# Patient Record
Sex: Female | Born: 1950
Health system: Southern US, Community
[De-identification: ages and names within clinical notes are randomized; demographics above are authoritative.]

## PROBLEM LIST (undated history)

## (undated) DIAGNOSIS — E785 Hyperlipidemia, unspecified: Secondary | ICD-10-CM

## (undated) HISTORY — DX: Hyperlipidemia, unspecified: E78.5

---

## 2010-07-22 HISTORY — PX: RIB FRACTURE SURGERY: SHX2358

## 2010-08-02 ENCOUNTER — Ambulatory Visit: Payer: Self-pay | Admitting: Internal Medicine

## 2010-11-21 HISTORY — PX: DIAPHRAGMATIC HERNIA REPAIR: SHX413

## 2014-10-29 LAB — HM PAP SMEAR

## 2014-11-12 ENCOUNTER — Other Ambulatory Visit: Payer: Self-pay | Admitting: Family Medicine

## 2014-11-12 DIAGNOSIS — Z1231 Encounter for screening mammogram for malignant neoplasm of breast: Secondary | ICD-10-CM

## 2014-11-17 ENCOUNTER — Ambulatory Visit
Admission: RE | Admit: 2014-11-17 | Discharge: 2014-11-17 | Disposition: A | Discharge status: Home / Self Care | Payer: No Typology Code available for payment source | Source: Ambulatory Visit | Attending: Family Medicine | Admitting: Family Medicine

## 2014-11-17 DIAGNOSIS — Z1231 Encounter for screening mammogram for malignant neoplasm of breast: Secondary | ICD-10-CM | POA: Diagnosis not present

## 2015-01-01 HISTORY — PX: COLONOSCOPY: SHX174

## 2015-01-01 LAB — HM COLONOSCOPY

## 2016-03-29 DIAGNOSIS — E785 Hyperlipidemia, unspecified: Secondary | ICD-10-CM | POA: Insufficient documentation

## 2016-03-29 DIAGNOSIS — E782 Mixed hyperlipidemia: Secondary | ICD-10-CM

## 2016-05-22 DIAGNOSIS — H524 Presbyopia: Secondary | ICD-10-CM | POA: Diagnosis not present

## 2016-05-30 ENCOUNTER — Ambulatory Visit: Payer: Self-pay | Admitting: Family Medicine

## 2016-06-02 ENCOUNTER — Ambulatory Visit: Payer: Self-pay | Admitting: Family Medicine

## 2016-06-29 ENCOUNTER — Ambulatory Visit (INDEPENDENT_AMBULATORY_CARE_PROVIDER_SITE_OTHER): Payer: Medicare HMO | Admitting: Family Medicine

## 2016-06-29 ENCOUNTER — Encounter: Payer: Self-pay | Admitting: Family Medicine

## 2016-06-29 VITALS — BP 125/82 | HR 71 | Temp 97.7°F | Ht 61.1 in | Wt 138.6 lb

## 2016-06-29 DIAGNOSIS — E782 Mixed hyperlipidemia: Secondary | ICD-10-CM

## 2016-06-29 NOTE — Progress Notes (Signed)
BP 125/82 (BP Location: Left Arm, Patient Position: Sitting, Cuff Size: Normal)   Pulse 71   Temp 97.7 F (36.5 C)   Ht 5' 1.1" (1.552 m)   Wt 138 lb 9.6 oz (62.9 kg)   SpO2 96%   BMI 26.10 kg/m    Subjective:    Patient ID: Heather Walsh, female    DOB: [birth year], 66 y.o.   MRN: [id number]  HPI: Heather Walsh is a 66 y.o. female who presents today to establish care.  Chief Complaint  Patient presents with  . Establish Care   HYPERLIPIDEMIA Hyperlipidemia status: Stable- has had elevated cholesterol in the past, doesn't want to take medication. She has been dieting and losing weight, lowest weight of adult life right now.  Satisfied with current treatment?  yes Side effects:  Not on anthing Medication compliance: poor compliance- doesn't want to take anything Past cholesterol meds: lipitor Supplements: none Aspirin:  no Chest pain:  no Coronary artery disease:  no Family history CAD:  yes  Active Ambulatory Problems    Diagnosis Date Noted  . Hyperlipidemia    Resolved Ambulatory Problems    Diagnosis Date Noted  . No Resolved Ambulatory Problems   Past Medical History:  Diagnosis Date  . Hyperlipidemia    Past Surgical History:  Procedure Laterality Date  . CESAREAN SECTION     X 2  . COLONOSCOPY  01/01/2015  . DIAPHRAGMATIC HERNIA REPAIR  11/2010  . RIB FRACTURE SURGERY Left 07/2010   repair with fixation   No outpatient encounter prescriptions on file as of 06/29/2016.   No facility-administered encounter medications on file as of 06/29/2016.    Allergies  Allergen Reactions  . Sulfa Antibiotics Swelling   Social History   Social History  . Marital status: Married    Spouse name: N/A  . Number of children: N/A  . Years of education: N/A   Occupational History  . Not on file.   Social History Main Topics  . Smoking status: Never Smoker  . Smokeless tobacco: Never Used  . Alcohol use No  . Drug use: Yes    Types: Marijuana  . Sexual  activity: Not Currently   Other Topics Concern  . Not on file   Social History Narrative  . No narrative on file   Family History  Problem Relation Age of Onset  . CAD Mother   . Cancer Mother        lung  . Heart attack Father   . Hearing loss Father   . Alcohol abuse Brother   . Cancer Brother        colon  . Cancer Son        bone  . Stroke Maternal Grandfather   . CAD Paternal Grandmother   . CAD Paternal Grandfather   . Cancer Sister        Throat  . Obesity Brother   . Alcohol abuse Son     Review of Systems  Constitutional: Negative.   Respiratory: Positive for cough and choking. Negative for apnea, chest tightness, shortness of breath, wheezing and stridor.   Cardiovascular: Negative.   Allergic/Immunologic: Positive for environmental allergies. Negative for food allergies and immunocompromised state.  Psychiatric/Behavioral: Negative.     Per HPI unless specifically indicated above     Objective:    BP 125/82 (BP Location: Left Arm, Patient Position: Sitting, Cuff Size: Normal)   Pulse 71   Temp 97.7 F (36.5 C)   Ht 5'  1.1" (1.552 m)   Wt 138 lb 9.6 oz (62.9 kg)   SpO2 96%   BMI 26.10 kg/m   Wt Readings from Last 3 Encounters:  06/29/16 138 lb 9.6 oz (62.9 kg)    Physical Exam  Constitutional: She is oriented to person, place, and time. She appears well-developed and well-nourished. No distress.  HENT:  Head: Normocephalic and atraumatic.  Right Ear: Hearing normal.  Left Ear: Hearing normal.  Nose: Nose normal.  Eyes: Conjunctivae and lids are normal. Right eye exhibits no discharge. Left eye exhibits no discharge. No scleral icterus.  Cardiovascular: Normal rate, regular rhythm, normal heart sounds and intact distal pulses.  Exam reveals no gallop and no friction rub.   No murmur heard. Pulmonary/Chest: Effort normal and breath sounds normal. No respiratory distress. She has no wheezes. She has no rales. She exhibits no tenderness.    Musculoskeletal: Normal range of motion.  Neurological: She is alert and oriented to person, place, and time.  Skin: Skin is warm, dry and intact. No rash noted. No erythema. No pallor.  Psychiatric: She has a normal mood and affect. Her speech is normal and behavior is normal. Judgment and thought content normal. Cognition and memory are normal.  Nursing note and vitals reviewed.   Results for orders placed or performed in visit on 03/29/16  HM PAP SMEAR  Result Value Ref Range   HM Pap smear per care everywhere   HM COLONOSCOPY  Result Value Ref Range   HM Colonoscopy Patient Reported See Report (in chart), Patient Reported      Assessment & Plan:   Problem List Items Addressed This Visit      Other   Hyperlipidemia - Primary    Does not want to take any medicine. Not fasting today. Will check labs at wellness exam. Call with any concerns.           Follow up plan: Return in about 6 months (around 12/30/2016) for Wellness.

## 2016-06-29 NOTE — Assessment & Plan Note (Signed)
Does not want to take any medicine. Not fasting today. Will check labs at wellness exam. Call with any concerns.

## 2016-06-29 NOTE — Patient Instructions (Addendum)
Health Maintenance, Female Adopting a healthy lifestyle and getting preventive care can go a long way to promote health and wellness. Talk with your health care provider about what schedule of regular examinations is right for you. This is a good chance for you to check in with your provider about disease prevention and staying healthy. In between checkups, there are plenty of things you can do on your own. Experts have done a lot of research about which lifestyle changes and preventive measures are most likely to keep you healthy. Ask your health care provider for more information. Weight and diet Eat a healthy diet  Be sure to include plenty of vegetables, fruits, low-fat dairy products, and lean protein.  Do not eat a lot of foods high in solid fats, added sugars, or salt.  Get regular exercise. This is one of the most important things you can do for your health.  Most adults should exercise for at least 150 minutes each week. The exercise should increase your heart rate and make you sweat (moderate-intensity exercise).  Most adults should also do strengthening exercises at least twice a week. This is in addition to the moderate-intensity exercise. Maintain a healthy weight  Body mass index (BMI) is a measurement that can be used to identify possible weight problems. It estimates body fat based on height and weight. Your health care provider can help determine your BMI and help you achieve or maintain a healthy weight.  For females 76 years of age and older:  A BMI below 18.5 is considered underweight.  A BMI of 18.5 to 24.9 is normal.  A BMI of 25 to 29.9 is considered overweight.  A BMI of 30 and above is considered obese. Watch levels of cholesterol and blood lipids  You should start having your blood tested for lipids and cholesterol at 65 years of age, then have this test every 5 years.  You may need to have your cholesterol levels checked more often if:  Your lipid or  cholesterol levels are high.  You are older than 65 years of age.  You are at high risk for heart disease. Cancer screening Lung Cancer  Lung cancer screening is recommended for adults 64-42 years old who are at high risk for lung cancer because of a history of smoking.  A yearly low-dose CT scan of the lungs is recommended for people who:  Currently smoke.  Have quit within the past 15 years.  Have at least a 30-pack-year history of smoking. A pack year is smoking an average of one pack of cigarettes a day for 1 year.  Yearly screening should continue until it has been 15 years since you quit.  Yearly screening should stop if you develop a health problem that would prevent you from having lung cancer treatment. Breast Cancer  Practice breast self-awareness. This means understanding how your breasts normally appear and feel.  It also means doing regular breast self-exams. Let your health care provider know about any changes, no matter how small.  If you are in your 20s or 30s, you should have a clinical breast exam (CBE) by a health care provider every 1-3 years as part of a regular health exam.  If you are 34 or older, have a CBE every year. Also consider having a breast X-ray (mammogram) every year.  If you have a family history of breast cancer, talk to your health care provider about genetic screening.  If you are at high risk for breast cancer, talk  to your health care provider about having an MRI and a mammogram every year.  Breast cancer gene (BRCA) assessment is recommended for women who have family members with BRCA-related cancers. BRCA-related cancers include:  Breast.  Ovarian.  Tubal.  Peritoneal cancers.  Results of the assessment will determine the need for genetic counseling and BRCA1 and BRCA2 testing. Cervical Cancer  Your health care provider may recommend that you be screened regularly for cancer of the pelvic organs (ovaries, uterus, and vagina).  This screening involves a pelvic examination, including checking for microscopic changes to the surface of your cervix (Pap test). You may be encouraged to have this screening done every 3 years, beginning at age 24.  For women ages 66-65, health care providers may recommend pelvic exams and Pap testing every 3 years, or they may recommend the Pap and pelvic exam, combined with testing for human papilloma virus (HPV), every 5 years. Some types of HPV increase your risk of cervical cancer. Testing for HPV may also be done on women of any age with unclear Pap test results.  Other health care providers may not recommend any screening for nonpregnant women who are considered low risk for pelvic cancer and who do not have symptoms. Ask your health care provider if a screening pelvic exam is right for you.  If you have had past treatment for cervical cancer or a condition that could lead to cancer, you need Pap tests and screening for cancer for at least 20 years after your treatment. If Pap tests have been discontinued, your risk factors (such as having a new sexual partner) need to be reassessed to determine if screening should resume. Some women have medical problems that increase the chance of getting cervical cancer. In these cases, your health care provider may recommend more frequent screening and Pap tests. Colorectal Cancer  This type of cancer can be detected and often prevented.  Routine colorectal cancer screening usually begins at 65 years of age and continues through 65 years of age.  Your health care provider may recommend screening at an earlier age if you have risk factors for colon cancer.  Your health care provider may also recommend using home test kits to check for hidden blood in the stool.  A small camera at the end of a tube can be used to examine your colon directly (sigmoidoscopy or colonoscopy). This is done to check for the earliest forms of colorectal cancer.  Routine  screening usually begins at age 41.  Direct examination of the colon should be repeated every 5-10 years through 65 years of age. However, you may need to be screened more often if early forms of precancerous polyps or small growths are found. Skin Cancer  Check your skin from head to toe regularly.  Tell your health care provider about any new moles or changes in moles, especially if there is a change in a mole's shape or color.  Also tell your health care provider if you have a mole that is larger than the size of a pencil eraser.  Always use sunscreen. Apply sunscreen liberally and repeatedly throughout the day.  Protect yourself by wearing long sleeves, pants, a wide-brimmed hat, and sunglasses whenever you are outside. Heart disease, diabetes, and high blood pressure  High blood pressure causes heart disease and increases the risk of stroke. High blood pressure is more likely to develop in:  People who have blood pressure in the high end of the normal range (130-139/85-89 mm Hg).  People who are overweight or obese.  People who are African American.  If you are 59-24 years of age, have your blood pressure checked every 3-5 years. If you are 34 years of age or older, have your blood pressure checked every year. You should have your blood pressure measured twice-once when you are at a hospital or clinic, and once when you are not at a hospital or clinic. Record the average of the two measurements. To check your blood pressure when you are not at a hospital or clinic, you can use:  An automated blood pressure machine at a pharmacy.  A home blood pressure monitor.  If you are between 29 years and 60 years old, ask your health care provider if you should take aspirin to prevent strokes.  Have regular diabetes screenings. This involves taking a blood sample to check your fasting blood sugar level.  If you are at a normal weight and have a low risk for diabetes, have this test once  every three years after 66 years of age.  If you are overweight and have a high risk for diabetes, consider being tested at a younger age or more often. Preventing infection Hepatitis B  If you have a higher risk for hepatitis B, you should be screened for this virus. You are considered at high risk for hepatitis B if:  You were born in a country where hepatitis B is common. Ask your health care provider which countries are considered high risk.  Your parents were born in a high-risk country, and you have not been immunized against hepatitis B (hepatitis B vaccine).  You have HIV or AIDS.  You use needles to inject street drugs.  You live with someone who has hepatitis B.  You have had sex with someone who has hepatitis B.  You get hemodialysis treatment.  You take certain medicines for conditions, including cancer, organ transplantation, and autoimmune conditions. Hepatitis C  Blood testing is recommended for:  Everyone born from [birth year] through 1964.  Anyone with known risk factors for hepatitis C. Sexually transmitted infections (STIs)  You should be screened for sexually transmitted infections (STIs) including gonorrhea and chlamydia if:  You are sexually active and are younger than 65 years of age.  You are older than 65 years of age and your health care provider tells you that you are at risk for this type of infection.  Your sexual activity has changed since you were last screened and you are at an increased risk for chlamydia or gonorrhea. Ask your health care provider if you are at risk.  If you do not have HIV, but are at risk, it may be recommended that you take a prescription medicine daily to prevent HIV infection. This is called pre-exposure prophylaxis (PrEP). You are considered at risk if:  You are sexually active and do not regularly use condoms or know the HIV status of your partner(s).  You take drugs by injection.  You are sexually active with a partner  who has HIV. Talk with your health care provider about whether you are at high risk of being infected with HIV. If you choose to begin PrEP, you should first be tested for HIV. You should then be tested every 3 months for as long as you are taking PrEP. Pregnancy  If you are premenopausal and you may become pregnant, ask your health care provider about preconception counseling.  If you may become pregnant, take 400 to 800 micrograms (mcg) of folic acid  every day.  If you want to prevent pregnancy, talk to your health care provider about birth control (contraception). Osteoporosis and menopause  Osteoporosis is a disease in which the bones lose minerals and strength with aging. This can result in serious bone fractures. Your risk for osteoporosis can be identified using a bone density scan.  If you are 63 years of age or older, or if you are at risk for osteoporosis and fractures, ask your health care provider if you should be screened.  Ask your health care provider whether you should take a calcium or vitamin D supplement to lower your risk for osteoporosis.  Menopause may have certain physical symptoms and risks.  Hormone replacement therapy may reduce some of these symptoms and risks. Talk to your health care provider about whether hormone replacement therapy is right for you. Follow these instructions at home:  Schedule regular health, dental, and eye exams.  Stay current with your immunizations.  Do not use any tobacco products including cigarettes, chewing tobacco, or electronic cigarettes.  If you are pregnant, do not drink alcohol.  If you are breastfeeding, limit how much and how often you drink alcohol.  Limit alcohol intake to no more than 1 drink per day for nonpregnant women. One drink equals 12 ounces of beer, 5 ounces of wine, or 1 ounces of hard liquor.  Do not use street drugs.  Do not share needles.  Ask your health care provider for help if you need support  or information about quitting drugs.  Tell your health care provider if you often feel depressed.  Tell your health care provider if you have ever been abused or do not feel safe at home. This information is not intended to replace advice given to you by your health care provider. Make sure you discuss any questions you have with your health care provider. Document Released: 08/22/2010 Document Revised: 07/15/2015 Document Reviewed: 11/10/2014 Elsevier Interactive Patient Education  2017 [place] Maintenance for Postmenopausal Women Menopause is a normal process in which your reproductive ability comes to an end. This process happens gradually over a span of months to years, usually between the ages of 48 and 29. Menopause is complete when you have missed 12 consecutive menstrual periods. It is important to talk with your health care provider about some of the most common conditions that affect postmenopausal women, such as heart disease, cancer, and bone loss (osteoporosis). Adopting a healthy lifestyle and getting preventive care can help to promote your health and wellness. Those actions can also lower your chances of developing some of these common conditions. What should I know about menopause? During menopause, you may experience a number of symptoms, such as:  Moderate-to-severe hot flashes.  Night sweats.  Decrease in sex drive.  Mood swings.  Headaches.  Tiredness.  Irritability.  Memory problems.  Insomnia. Choosing to treat or not to treat menopausal changes is an individual decision that you make with your health care provider. What should I know about hormone replacement therapy and supplements? Hormone therapy products are effective for treating symptoms that are associated with menopause, such as hot flashes and night sweats. Hormone replacement carries certain risks, especially as you become older. If you are thinking about using estrogen or estrogen  with progestin treatments, discuss the benefits and risks with your health care provider. What should I know about heart disease and stroke? Heart disease, heart attack, and stroke become more likely as you age. This may be due, in  part, to the hormonal changes that your body experiences during menopause. These can affect how your body processes dietary fats, triglycerides, and cholesterol. Heart attack and stroke are both medical emergencies. There are many things that you can do to help prevent heart disease and stroke:  Have your blood pressure checked at least every 1-2 years. High blood pressure causes heart disease and increases the risk of stroke.  If you are 54-61 years old, ask your health care provider if you should take aspirin to prevent a heart attack or a stroke.  Do not use any tobacco products, including cigarettes, chewing tobacco, or electronic cigarettes. If you need help quitting, ask your health care provider.  It is important to eat a healthy diet and maintain a healthy weight.  Be sure to include plenty of vegetables, fruits, low-fat dairy products, and lean protein.  Avoid eating foods that are high in solid fats, added sugars, or salt (sodium).  Get regular exercise. This is one of the most important things that you can do for your health.  Try to exercise for at least 150 minutes each week. The type of exercise that you do should increase your heart rate and make you sweat. This is known as moderate-intensity exercise.  Try to do strengthening exercises at least twice each week. Do these in addition to the moderate-intensity exercise.  Know your numbers.Ask your health care provider to check your cholesterol and your blood glucose. Continue to have your blood tested as directed by your health care provider. What should I know about cancer screening? There are several types of cancer. Take the following steps to reduce your risk and to catch any cancer development  as early as possible. Breast Cancer  Practice breast self-awareness.  This means understanding how your breasts normally appear and feel.  It also means doing regular breast self-exams. Let your health care provider know about any changes, no matter how small.  If you are 63 or older, have a clinician do a breast exam (clinical breast exam or CBE) every year. Depending on your age, family history, and medical history, it may be recommended that you also have a yearly breast X-ray (mammogram).  If you have a family history of breast cancer, talk with your health care provider about genetic screening.  If you are at high risk for breast cancer, talk with your health care provider about having an MRI and a mammogram every year.  Breast cancer (BRCA) gene test is recommended for women who have family members with BRCA-related cancers. Results of the assessment will determine the need for genetic counseling and BRCA1 and for BRCA2 testing. BRCA-related cancers include these types:  Breast. This occurs in males or females.  Ovarian.  Tubal. This may also be called fallopian tube cancer.  Cancer of the abdominal or pelvic lining (peritoneal cancer).  Prostate.  Pancreatic. Cervical, Uterine, and Ovarian Cancer  Your health care provider may recommend that you be screened regularly for cancer of the pelvic organs. These include your ovaries, uterus, and vagina. This screening involves a pelvic exam, which includes checking for microscopic changes to the surface of your cervix (Pap test).  For women ages 21-65, health care providers may recommend a pelvic exam and a Pap test every three years. For women ages 61-65, they may recommend the Pap test and pelvic exam, combined with testing for human papilloma virus (HPV), every five years. Some types of HPV increase your risk of cervical cancer. Testing for HPV may  also be done on women of any age who have unclear Pap test results.  Other health  care providers may not recommend any screening for nonpregnant women who are considered low risk for pelvic cancer and have no symptoms. Ask your health care provider if a screening pelvic exam is right for you.  If you have had past treatment for cervical cancer or a condition that could lead to cancer, you need Pap tests and screening for cancer for at least 20 years after your treatment. If Pap tests have been discontinued for you, your risk factors (such as having a new sexual partner) need to be reassessed to determine if you should start having screenings again. Some women have medical problems that increase the chance of getting cervical cancer. In these cases, your health care provider may recommend that you have screening and Pap tests more often.  If you have a family history of uterine cancer or ovarian cancer, talk with your health care provider about genetic screening.  If you have vaginal bleeding after reaching menopause, tell your health care provider.  There are currently no reliable tests available to screen for ovarian cancer. Lung Cancer  Lung cancer screening is recommended for adults 61-47 years old who are at high risk for lung cancer because of a history of smoking. A yearly low-dose CT scan of the lungs is recommended if you:  Currently smoke.  Have a history of at least 30 pack-years of smoking and you currently smoke or have quit within the past 15 years. A pack-year is smoking an average of one pack of cigarettes per day for one year. Yearly screening should:  Continue until it has been 15 years since you quit.  Stop if you develop a health problem that would prevent you from having lung cancer treatment. Colorectal Cancer  This type of cancer can be detected and can often be prevented.  Routine colorectal cancer screening usually begins at age 2 and continues through age 50.  If you have risk factors for colon cancer, your health care provider may recommend  that you be screened at an earlier age.  If you have a family history of colorectal cancer, talk with your health care provider about genetic screening.  Your health care provider may also recommend using home test kits to check for hidden blood in your stool.  A small camera at the end of a tube can be used to examine your colon directly (sigmoidoscopy or colonoscopy). This is done to check for the earliest forms of colorectal cancer.  Direct examination of the colon should be repeated every 5-10 years until age 54. However, if early forms of precancerous polyps or small growths are found or if you have a family history or genetic risk for colorectal cancer, you may need to be screened more often. Skin Cancer  Check your skin from head to toe regularly.  Monitor any moles. Be sure to tell your health care provider:  About any new moles or changes in moles, especially if there is a change in a mole's shape or color.  If you have a mole that is larger than the size of a pencil eraser.  If any of your family members has a history of skin cancer, especially at a young age, talk with your health care provider about genetic screening.  Always use sunscreen. Apply sunscreen liberally and repeatedly throughout the day.  Whenever you are outside, protect yourself by wearing long sleeves, pants, a wide-brimmed hat, and  sunglasses. What should I know about osteoporosis? Osteoporosis is a condition in which bone destruction happens more quickly than new bone creation. After menopause, you may be at an increased risk for osteoporosis. To help prevent osteoporosis or the bone fractures that can happen because of osteoporosis, the following is recommended:  If you are 77-55 years old, get at least 1,000 mg of calcium and at least 600 mg of vitamin D per day.  If you are older than age 10 but younger than age 17, get at least 1,200 mg of calcium and at least 600 mg of vitamin D per day.  If you are  older than age 49, get at least 1,200 mg of calcium and at least 800 mg of vitamin D per day. Smoking and excessive alcohol intake increase the risk of osteoporosis. Eat foods that are rich in calcium and vitamin D, and do weight-bearing exercises several times each week as directed by your health care provider. What should I know about how menopause affects my mental health? Depression may occur at any age, but it is more common as you become older. Common symptoms of depression include:  Low or sad mood.  Changes in sleep patterns.  Changes in appetite or eating patterns.  Feeling an overall lack of motivation or enjoyment of activities that you previously enjoyed.  Frequent crying spells. Talk with your health care provider if you think that you are experiencing depression. What should I know about immunizations? It is important that you get and maintain your immunizations. These include:  Tetanus, diphtheria, and pertussis (Tdap) booster vaccine.  Influenza every year before the flu season begins.  Pneumonia vaccine.  Shingles vaccine. Your health care provider may also recommend other immunizations. This information is not intended to replace advice given to you by your health care provider. Make sure you discuss any questions you have with your health care provider. Document Released: 03/31/2005 Document Revised: 08/27/2015 Document Reviewed: 11/10/2014 Elsevier Interactive Patient Education  2017 [employer].

## 2017-01-02 ENCOUNTER — Ambulatory Visit (INDEPENDENT_AMBULATORY_CARE_PROVIDER_SITE_OTHER): Payer: Medicare HMO | Admitting: Family Medicine

## 2017-01-02 ENCOUNTER — Encounter: Payer: Self-pay | Admitting: Family Medicine

## 2017-01-02 VITALS — BP 121/80 | HR 66 | Temp 97.9°F | Ht 62.4 in | Wt 142.0 lb

## 2017-01-02 DIAGNOSIS — R5383 Other fatigue: Secondary | ICD-10-CM | POA: Diagnosis not present

## 2017-01-02 DIAGNOSIS — Z0000 Encounter for general adult medical examination without abnormal findings: Secondary | ICD-10-CM | POA: Diagnosis not present

## 2017-01-02 DIAGNOSIS — Z13820 Encounter for screening for osteoporosis: Secondary | ICD-10-CM | POA: Diagnosis not present

## 2017-01-02 DIAGNOSIS — Z7189 Other specified counseling: Secondary | ICD-10-CM | POA: Diagnosis not present

## 2017-01-02 DIAGNOSIS — Z23 Encounter for immunization: Secondary | ICD-10-CM | POA: Diagnosis not present

## 2017-01-02 DIAGNOSIS — R51 Headache: Secondary | ICD-10-CM | POA: Diagnosis not present

## 2017-01-02 DIAGNOSIS — Z1231 Encounter for screening mammogram for malignant neoplasm of breast: Secondary | ICD-10-CM

## 2017-01-02 DIAGNOSIS — E782 Mixed hyperlipidemia: Secondary | ICD-10-CM | POA: Diagnosis not present

## 2017-01-02 DIAGNOSIS — R062 Wheezing: Secondary | ICD-10-CM

## 2017-01-02 DIAGNOSIS — R519 Headache, unspecified: Secondary | ICD-10-CM

## 2017-01-02 DIAGNOSIS — Z87891 Personal history of nicotine dependence: Secondary | ICD-10-CM

## 2017-01-02 DIAGNOSIS — Z1239 Encounter for other screening for malignant neoplasm of breast: Secondary | ICD-10-CM

## 2017-01-02 LAB — UA/M W/RFLX CULTURE, ROUTINE
BILIRUBIN UA: NEGATIVE
GLUCOSE, UA: NEGATIVE
KETONES UA: NEGATIVE
Nitrite, UA: NEGATIVE
Protein, UA: NEGATIVE
SPEC GRAV UA: 1.02 (ref 1.005–1.030)
Urobilinogen, Ur: 0.2 mg/dL (ref 0.2–1.0)
pH, UA: 5 (ref 5.0–7.5)

## 2017-01-02 LAB — MICROSCOPIC EXAMINATION
BACTERIA UA: NONE SEEN
RBC MICROSCOPIC, UA: NONE SEEN /HPF (ref 0–?)

## 2017-01-02 MED ORDER — ALBUTEROL SULFATE HFA 108 (90 BASE) MCG/ACT IN AERS: 2.0000 | INHALATION_SPRAY | Freq: Four times a day (QID) | RESPIRATORY_TRACT | 0 refills | Status: DC | PRN

## 2017-01-02 NOTE — Progress Notes (Signed)
BP 121/80 (BP Location: Left Arm, Patient Position: Sitting, Cuff Size: Normal)   Pulse 66   Temp 97.9 F (36.6 C)   Ht 5' 2.4" (1.585 m)   Wt 142 lb (64.4 kg)   SpO2 98%   BMI 25.64 kg/m    Subjective:    Patient ID: Heather Walsh, female    DOB: 18-Aug-1950, 66 y.o.   MRN: [id number]  HPI: Heather Walsh is a 66 y.o. female presenting on 01/02/2017 for comprehensive medical examination. Current medical complaints include: has gained 4 lbs- her son moved in with her so she's been a little stressed.   She currently lives with: son Menopausal Symptoms: no  Functional Status Survey: Is the patient deaf or have difficulty hearing?: No Does the patient have difficulty seeing, even when wearing glasses/contacts?: No Does the patient have difficulty concentrating, remembering, or making decisions?: No Does the patient have difficulty walking or climbing stairs?: No Does the patient have difficulty dressing or bathing?: No Does the patient have difficulty doing errands alone such as visiting a doctor's office or shopping?: No  Fall Risk  01/02/2017 06/29/2016  Falls in the past year? No No    Depression Screen Depression screen [facility] 2/9 01/02/2017 06/29/2016  Decreased Interest 0 0  Down, Depressed, Hopeless 0 0  PHQ - 2 Score 0 0   Advanced Directives Does patient have a HCPOA?    no Does patient have a living will or MOST form?  no  Past Medical History:  Past Medical History:  Diagnosis Date  . Hyperlipidemia     Surgical History:  Past Surgical History:  Procedure Laterality Date  . CESAREAN SECTION     X 2  . COLONOSCOPY  01/01/2015  . DIAPHRAGMATIC HERNIA REPAIR  11/2010  . RIB FRACTURE SURGERY Left 07/2010   repair with fixation    Medications:  No current outpatient medications on file prior to visit.   No current facility-administered medications on file prior to visit.     Allergies:  Allergies  Allergen Reactions  . Sulfa Antibiotics Swelling     Social History:  Social History   Socioeconomic History  . Marital status: Married    Spouse name: Not on file  . Number of children: Not on file  . Years of education: Not on file  . Highest education level: Not on file  Social Needs  . Financial resource strain: Not on file  . Food insecurity - worry: Not on file  . Food insecurity - inability: Not on file  . Transportation needs - medical: Not on file  . Transportation needs - non-medical: Not on file  Occupational History  . Not on file  Tobacco Use  . Smoking status: Never Smoker  . Smokeless tobacco: Never Used  Substance and Sexual Activity  . Alcohol use: No  . Drug use: Yes    Types: Marijuana  . Sexual activity: Not Currently  Other Topics Concern  . Not on file  Social History Narrative  . Not on file   Social History   Tobacco Use  Smoking Status Never Smoker  Smokeless Tobacco Never Used   Social History   Substance and Sexual Activity  Alcohol Use No    Family History:  Family History  Problem Relation Age of Onset  . CAD Mother   . Cancer Mother        lung  . Heart attack Father   . Hearing loss Father   . Alcohol abuse Brother   .  Cancer Brother        colon  . Cancer Son        bone  . Stroke Maternal Grandfather   . CAD Paternal Grandmother   . CAD Paternal Grandfather   . Cancer Sister        Throat  . Obesity Brother   . Alcohol abuse Son     Past medical history, surgical history, medications, allergies, family history and social history reviewed with patient today and changes made to appropriate areas of the chart.   Review of Systems  Constitutional: Negative.   HENT: Negative.   Eyes: Positive for blurred vision. Negative for double vision, photophobia, pain, discharge and redness.  Respiratory: Negative.   Cardiovascular: Negative.   Gastrointestinal: Negative.   Genitourinary: Negative.   Musculoskeletal: Negative.   Skin: Negative.   Neurological: Negative.    Endo/Heme/Allergies: Negative.   Psychiatric/Behavioral: Negative.     All other ROS negative except what is listed above and in the HPI.      Objective:    BP 121/80 (BP Location: Left Arm, Patient Position: Sitting, Cuff Size: Normal)   Pulse 66   Temp 97.9 F (36.6 C)   Ht 5' 2.4" (1.585 m)   Wt 142 lb (64.4 kg)   SpO2 98%   BMI 25.64 kg/m   Wt Readings from Last 3 Encounters:  01/02/17 142 lb (64.4 kg)  06/29/16 138 lb 9.6 oz (62.9 kg)    Physical Exam  Constitutional: She is oriented to person, place, and time. She appears well-developed and well-nourished. No distress.  HENT:  Head: Normocephalic and atraumatic.  Right Ear: Hearing and external ear normal.  Left Ear: Hearing and external ear normal.  Nose: Nose normal.  Mouth/Throat: Oropharynx is clear and moist. No oropharyngeal exudate.  Eyes: Conjunctivae, EOM and lids are normal. Pupils are equal, round, and reactive to light. Right eye exhibits no discharge. Left eye exhibits no discharge. No scleral icterus.  Neck: Normal range of motion. Neck supple. No JVD present. No tracheal deviation present. No thyromegaly present.  Cardiovascular: Normal rate, regular rhythm, normal heart sounds and intact distal pulses. Exam reveals no gallop and no friction rub.  No murmur heard. Pulmonary/Chest: Effort normal. No stridor. No respiratory distress. She has wheezes. She has no rales. She exhibits no tenderness.  Abdominal: Soft. Bowel sounds are normal. She exhibits no distension and no mass. There is no tenderness. There is no rebound and no guarding.  Genitourinary:  Genitourinary Comments: Breast and pelvic exams deferred with shared decision making  Musculoskeletal: Normal range of motion. She exhibits no edema, tenderness or deformity.  Jaw click on the L  Lymphadenopathy:    She has no cervical adenopathy.  Neurological: She is alert and oriented to person, place, and time. She has normal reflexes. She displays  normal reflexes. No cranial nerve deficit. She exhibits normal muscle tone. Coordination normal.  Skin: Skin is warm, dry and intact. No rash noted. She is not diaphoretic. No erythema. No pallor.  Psychiatric: She has a normal mood and affect. Her speech is normal and behavior is normal. Judgment and thought content normal. Cognition and memory are normal.  Nursing note and vitals reviewed.   6CIT Screen 01/02/2017  What Year? 0 points  What month? 0 points  What time? 0 points  Count back from 20 0 points  Months in reverse 0 points  Repeat phrase 4 points  Total Score 4     Results for orders  placed or performed in visit on 03/29/16  HM PAP SMEAR  Result Value Ref Range   HM Pap smear per care everywhere   HM COLONOSCOPY  Result Value Ref Range   HM Colonoscopy Patient Reported See Report (in chart), Patient Reported      Assessment & Plan:   Problem List Items Addressed This Visit      Other   Hyperlipidemia    Not interested in medication. Will check levels today. Await results.       Relevant Orders   Comprehensive metabolic panel   Lipid Panel w/o Chol/HDL Ratio   Advance directive discussed with patient    A voluntary discussion about advance care planning including the explanation and discussion of advance directives was extensively discussed  with the patient.  Explanation about the health care proxy and Living will was reviewed and packet with forms with explanation of how to fill them out was given.  During this discussion, the patient was not able to identify a health care proxy and plans to fill out the paperwork required.  Patient was offered a separate [facility] visit for further assistance with forms.          Other Visit Diagnoses    Medicare annual wellness visit, subsequent    -  Primary   Preventative care done as discussed below. Call with any concerns.    Routine general medical examination at a health care facility       Vaccines  updated. Screening labs checked today. Pap N/A. Mammogram and DEXA ordered today. Colonoscopy up to date. Continue diet and exercise.    Screening for breast cancer       Mammogram ordered today   Relevant Orders   MM Digital Screening   Screening for osteoporosis       DEXA ordered today   Relevant Orders   DG Bone Density   Other fatigue       Labs checked today. Await results.    Relevant Orders   CBC with Differential/Platelet   TSH   Former smoker       Labs checked today. Await results.    Relevant Orders   CBC with Differential/Platelet   UA/M w/rflx Culture, Routine   Temporal pain       Likely due to TMJ- exercises given today. Will check ESR to make sure no sign of TA- given it only happens every few months, this is unlikely.   Relevant Orders   Sed Rate (ESR)   Immunization due       Prevnar given today.   Relevant Orders   Pneumococcal conjugate vaccine 13-valent   Wheezes       Will treat with albuterol. Call if not getting better or getting worse.        Preventative Services:  Health Risk Assessment and Personalized Prevention Plan: Done today Bone Mass Measurements: Ordered today Breast Cancer Screening: Ordered today CVD Screening: Done today Cervical Cancer Screening: N/A Colon Cancer Screening: Up to date Depression Screening: Done today Diabetes Screening: Done today Glaucoma Screening: See your Eye doctor Hepatitis B vaccine: N/A Hepatitis C screening: Done already HIV Screening: Done already Flu Vaccine: Refused Lung cancer Screening: N/A Obesity Screening: Done Today Pneumonia Vaccines (2): Prevnar given today- pneumovax due next year STI Screening: N/A  Follow up plan: Return for Wellness.  LABORATORY TESTING:  - Pap smear: not applicable  IMMUNIZATIONS:   - Tdap: Tetanus vaccination status reviewed: last tetanus booster within 10 years. -  Influenza: Refused - Pneumovax: Not applicable - Prevnar: Given today - Zostavax vaccine:  Given elsewhere  SCREENING: -Mammogram: Ordered today  - Colonoscopy: Up to date  - Bone Density: Ordered today   PATIENT COUNSELING:   Advised to take 1 mg of folate supplement per day if capable of pregnancy.   Sexuality: Discussed sexually transmitted diseases, partner selection, use of condoms, avoidance of unintended pregnancy  and contraceptive alternatives.   Advised to avoid cigarette smoking.  I discussed with the patient that most people either abstain from alcohol or drink within safe limits (<=14/week and <=4 drinks/occasion for males, <=7/weeks and <= 3 drinks/occasion for females) and that the risk for alcohol disorders and other health effects rises proportionally with the number of drinks per week and how often a drinker exceeds daily limits.  Discussed cessation/primary prevention of drug use and availability of treatment for abuse.   Diet: Encouraged to adjust caloric intake to maintain  or achieve ideal body weight, to reduce intake of dietary saturated fat and total fat, to limit sodium intake by avoiding high sodium foods and not adding table salt, and to maintain adequate dietary potassium and calcium preferably from fresh fruits, vegetables, and low-fat dairy products.    stressed the importance of regular exercise  Injury prevention: Discussed safety belts, safety helmets, smoke detector, smoking near bedding or upholstery.   Dental health: Discussed importance of regular tooth brushing, flossing, and dental visits.    NEXT PREVENTATIVE PHYSICAL DUE IN 1 YEAR. Return for Wellness.

## 2017-01-02 NOTE — Assessment & Plan Note (Signed)
A voluntary discussion about advance care planning including the explanation and discussion of advance directives was extensively discussed  with the patient.  Explanation about the health care proxy and Living will was reviewed and packet with forms with explanation of how to fill them out was given.  During this discussion, the patient was not able to identify a health care proxy and plans to fill out the paperwork required.  Patient was offered a separate Advance Care Planning visit for further assistance with forms.    

## 2017-01-02 NOTE — Patient Instructions (Addendum)
[facility]  Address: [address] [place], [place], [place] [address]  Phone: [phone]  Preventative Services:  Health Risk Assessment and Personalized Prevention Plan: Done today Bone Mass Measurements: Ordered today Breast Cancer Screening: Ordered today CVD Screening: Done today Cervical Cancer Screening: N/A Colon Cancer Screening: Up to date Depression Screening: Done today Diabetes Screening: Done today Glaucoma Screening: See your Eye doctor Hepatitis B vaccine: N/A Hepatitis C screening: Done already HIV Screening: Done already Flu Vaccine: Refused Lung cancer Screening: N/A Obesity Screening: Done Today Pneumonia Vaccines (2): Prevnar given today- pneumovax due next year STI Screening: N/A  Health Maintenance, Female Adopting a healthy lifestyle and getting preventive care can go a long way to promote health and wellness. Talk with your health care provider about what schedule of regular examinations is right for you. This is a good chance for you to check in with your provider about disease prevention and staying healthy. In between checkups, there are plenty of things you can do on your own. Experts have done a lot of research about which lifestyle changes and preventive measures are most likely to keep you healthy. Ask your health care provider for more information. Weight and diet Eat a healthy diet  Be sure to include plenty of vegetables, fruits, low-fat dairy products, and lean protein.  Do not eat a lot of foods high in solid fats, added sugars, or salt.  Get regular exercise. This is one of the most important things you can do for your health. ? Most adults should exercise for at least 150 minutes each week. The exercise should increase your heart rate and make you sweat (moderate-intensity exercise). ? Most adults should also do strengthening exercises at least twice a week. This is in addition to the moderate-intensity  exercise.  Maintain a healthy weight  Body mass index (BMI) is a measurement that can be used to identify possible weight problems. It estimates body fat based on height and weight. Your health care provider can help determine your BMI and help you achieve or maintain a healthy weight.  For females 5 years of age and older: ? A BMI below 18.5 is considered underweight. ? A BMI of 18.5 to 24.9 is normal. ? A BMI of 25 to 29.9 is considered overweight. ? A BMI of 30 and above is considered obese.  Watch levels of cholesterol and blood lipids  You should start having your blood tested for lipids and cholesterol at 65 years of age, then have this test every 5 years.  You may need to have your cholesterol levels checked more often if: ? Your lipid or cholesterol levels are high. ? You are older than 65 years of age. ? You are at high risk for heart disease.  Cancer screening Lung Cancer  Lung cancer screening is recommended for adults 51-23 years old who are at high risk for lung cancer because of a history of smoking.  A yearly low-dose CT scan of the lungs is recommended for people who: ? Currently smoke. ? Have quit within the past 15 years. ? Have at least a 30-pack-year history of smoking. A pack year is smoking an average of one pack of cigarettes a day for 1 year.  Yearly screening should continue until it has been 15 years since you quit.  Yearly screening should stop if you develop a health problem that would prevent you from having lung cancer treatment.  Breast Cancer  Practice breast self-awareness. This  means understanding how your breasts normally appear and feel.  It also means doing regular breast self-exams. Let your health care provider know about any changes, no matter how small.  If you are in your 20s or 30s, you should have a clinical breast exam (CBE) by a health care provider every 1-3 years as part of a regular health exam.  If you are 59 or older, have  a CBE every year. Also consider having a breast X-ray (mammogram) every year.  If you have a family history of breast cancer, talk to your health care provider about genetic screening.  If you are at high risk for breast cancer, talk to your health care provider about having an MRI and a mammogram every year.  Breast cancer gene (BRCA) assessment is recommended for women who have family members with BRCA-related cancers. BRCA-related cancers include: ? Breast. ? Ovarian. ? Tubal. ? Peritoneal cancers.  Results of the assessment will determine the need for genetic counseling and BRCA1 and BRCA2 testing.  Cervical Cancer Your health care provider may recommend that you be screened regularly for cancer of the pelvic organs (ovaries, uterus, and vagina). This screening involves a pelvic examination, including checking for microscopic changes to the surface of your cervix (Pap test). You may be encouraged to have this screening done every 3 years, beginning at age 75.  For women ages 63-65, health care providers may recommend pelvic exams and Pap testing every 3 years, or they may recommend the Pap and pelvic exam, combined with testing for human papilloma virus (HPV), every 5 years. Some types of HPV increase your risk of cervical cancer. Testing for HPV may also be done on women of any age with unclear Pap test results.  Other health care providers may not recommend any screening for nonpregnant women who are considered low risk for pelvic cancer and who do not have symptoms. Ask your health care provider if a screening pelvic exam is right for you.  If you have had past treatment for cervical cancer or a condition that could lead to cancer, you need Pap tests and screening for cancer for at least 20 years after your treatment. If Pap tests have been discontinued, your risk factors (such as having a new sexual partner) need to be reassessed to determine if screening should resume. Some women have  medical problems that increase the chance of getting cervical cancer. In these cases, your health care provider may recommend more frequent screening and Pap tests.  Colorectal Cancer  This type of cancer can be detected and often prevented.  Routine colorectal cancer screening usually begins at 65 years of age and continues through 65 years of age.  Your health care provider may recommend screening at an earlier age if you have risk factors for colon cancer.  Your health care provider may also recommend using home test kits to check for hidden blood in the stool.  A small camera at the end of a tube can be used to examine your colon directly (sigmoidoscopy or colonoscopy). This is done to check for the earliest forms of colorectal cancer.  Routine screening usually begins at age 49.  Direct examination of the colon should be repeated every 5-10 years through 65 years of age. However, you may need to be screened more often if early forms of precancerous polyps or small growths are found.  Skin Cancer  Check your skin from head to toe regularly.  Tell your health care provider about any new  moles or changes in moles, especially if there is a change in a mole's shape or color.  Also tell your health care provider if you have a mole that is larger than the size of a pencil eraser.  Always use sunscreen. Apply sunscreen liberally and repeatedly throughout the day.  Protect yourself by wearing long sleeves, pants, a wide-brimmed hat, and sunglasses whenever you are outside.  Heart disease, diabetes, and high blood pressure  High blood pressure causes heart disease and increases the risk of stroke. High blood pressure is more likely to develop in: ? People who have blood pressure in the high end of the normal range (130-139/85-89 mm Hg). ? People who are overweight or obese. ? People who are African American.  If you are 2-81 years of age, have your blood pressure checked every 3-5  years. If you are 40 years of age or older, have your blood pressure checked every year. You should have your blood pressure measured twice-once when you are at a hospital or clinic, and once when you are not at a hospital or clinic. Record the average of the two measurements. To check your blood pressure when you are not at a hospital or clinic, you can use: ? An automated blood pressure machine at a pharmacy. ? A home blood pressure monitor.  If you are between 57 years and 56 years old, ask your health care provider if you should take aspirin to prevent strokes.  Have regular diabetes screenings. This involves taking a blood sample to check your fasting blood sugar level. ? If you are at a normal weight and have a low risk for diabetes, have this test once every three years after 65 years of age. ? If you are overweight and have a high risk for diabetes, consider being tested at a younger age or more often. Preventing infection Hepatitis B  If you have a higher risk for hepatitis B, you should be screened for this virus. You are considered at high risk for hepatitis B if: ? You were born in a country where hepatitis B is common. Ask your health care provider which countries are considered high risk. ? Your parents were born in a high-risk country, and you have not been immunized against hepatitis B (hepatitis B vaccine). ? You have HIV or AIDS. ? You use needles to inject street drugs. ? You live with someone who has hepatitis B. ? You have had sex with someone who has hepatitis B. ? You get hemodialysis treatment. ? You take certain medicines for conditions, including cancer, organ transplantation, and autoimmune conditions.  Hepatitis C  Blood testing is recommended for: ? Everyone born from [birth year] through 1964. ? Anyone with known risk factors for hepatitis C.  Sexually transmitted infections (STIs)  You should be screened for sexually transmitted infections (STIs) including  gonorrhea and chlamydia if: ? You are sexually active and are younger than 65 years of age. ? You are older than 65 years of age and your health care provider tells you that you are at risk for this type of infection. ? Your sexual activity has changed since you were last screened and you are at an increased risk for chlamydia or gonorrhea. Ask your health care provider if you are at risk.  If you do not have HIV, but are at risk, it may be recommended that you take a prescription medicine daily to prevent HIV infection. This is called pre-exposure prophylaxis (PrEP). You are considered at risk  if: ? You are sexually active and do not regularly use condoms or know the HIV status of your partner(s). ? You take drugs by injection. ? You are sexually active with a partner who has HIV.  Talk with your health care provider about whether you are at high risk of being infected with HIV. If you choose to begin PrEP, you should first be tested for HIV. You should then be tested every 3 months for as long as you are taking PrEP. Pregnancy  If you are premenopausal and you may become pregnant, ask your health care provider about preconception counseling.  If you may become pregnant, take 400 to 800 micrograms (mcg) of folic acid every day.  If you want to prevent pregnancy, talk to your health care provider about birth control (contraception). Osteoporosis and menopause  Osteoporosis is a disease in which the bones lose minerals and strength with aging. This can result in serious bone fractures. Your risk for osteoporosis can be identified using a bone density scan.  If you are 18 years of age or older, or if you are at risk for osteoporosis and fractures, ask your health care provider if you should be screened.  Ask your health care provider whether you should take a calcium or vitamin D supplement to lower your risk for osteoporosis.  Menopause may have certain physical symptoms and  risks.  Hormone replacement therapy may reduce some of these symptoms and risks. Talk to your health care provider about whether hormone replacement therapy is right for you. Follow these instructions at home:  Schedule regular health, dental, and eye exams.  Stay current with your immunizations.  Do not use any tobacco products including cigarettes, chewing tobacco, or electronic cigarettes.  If you are pregnant, do not drink alcohol.  If you are breastfeeding, limit how much and how often you drink alcohol.  Limit alcohol intake to no more than 1 drink per day for nonpregnant women. One drink equals 12 ounces of beer, 5 ounces of wine, or 1 ounces of hard liquor.  Do not use street drugs.  Do not share needles.  Ask your health care provider for help if you need support or information about quitting drugs.  Tell your health care provider if you often feel depressed.  Tell your health care provider if you have ever been abused or do not feel safe at home. This information is not intended to replace advice given to you by your health care provider. Make sure you discuss any questions you have with your health care provider. Document Released: 08/22/2010 Document Revised: 07/15/2015 Document Reviewed: 11/10/2014 Elsevier Interactive Patient Education  2018 [place] Maintenance for Postmenopausal Women Menopause is a normal process in which your reproductive ability comes to an end. This process happens gradually over a span of months to years, usually between the ages of 21 and 51. Menopause is complete when you have missed 12 consecutive menstrual periods. It is important to talk with your health care provider about some of the most common conditions that affect postmenopausal women, such as heart disease, cancer, and bone loss (osteoporosis). Adopting a healthy lifestyle and getting preventive care can help to promote your health and wellness. Those actions can also  lower your chances of developing some of these common conditions. What should I know about menopause? During menopause, you may experience a number of symptoms, such as:  Moderate-to-severe hot flashes.  Night sweats.  Decrease in sex drive.  Mood swings.  Headaches.  Tiredness.  Irritability.  Memory problems.  Insomnia.  Choosing to treat or not to treat menopausal changes is an individual decision that you make with your health care provider. What should I know about hormone replacement therapy and supplements? Hormone therapy products are effective for treating symptoms that are associated with menopause, such as hot flashes and night sweats. Hormone replacement carries certain risks, especially as you become older. If you are thinking about using estrogen or estrogen with progestin treatments, discuss the benefits and risks with your health care provider. What should I know about heart disease and stroke? Heart disease, heart attack, and stroke become more likely as you age. This may be due, in part, to the hormonal changes that your body experiences during menopause. These can affect how your body processes dietary fats, triglycerides, and cholesterol. Heart attack and stroke are both medical emergencies. There are many things that you can do to help prevent heart disease and stroke:  Have your blood pressure checked at least every 1-2 years. High blood pressure causes heart disease and increases the risk of stroke.  If you are 83-63 years old, ask your health care provider if you should take aspirin to prevent a heart attack or a stroke.  Do not use any tobacco products, including cigarettes, chewing tobacco, or electronic cigarettes. If you need help quitting, ask your health care provider.  It is important to eat a healthy diet and maintain a healthy weight. ? Be sure to include plenty of vegetables, fruits, low-fat dairy products, and lean protein. ? Avoid eating foods  that are high in solid fats, added sugars, or salt (sodium).  Get regular exercise. This is one of the most important things that you can do for your health. ? Try to exercise for at least 150 minutes each week. The type of exercise that you do should increase your heart rate and make you sweat. This is known as moderate-intensity exercise. ? Try to do strengthening exercises at least twice each week. Do these in addition to the moderate-intensity exercise.  Know your numbers.Ask your health care provider to check your cholesterol and your blood glucose. Continue to have your blood tested as directed by your health care provider.  What should I know about cancer screening? There are several types of cancer. Take the following steps to reduce your risk and to catch any cancer development as early as possible. Breast Cancer  Practice breast self-awareness. ? This means understanding how your breasts normally appear and feel. ? It also means doing regular breast self-exams. Let your health care provider know about any changes, no matter how small.  If you are 66 or older, have a clinician do a breast exam (clinical breast exam or CBE) every year. Depending on your age, family history, and medical history, it may be recommended that you also have a yearly breast X-ray (mammogram).  If you have a family history of breast cancer, talk with your health care provider about genetic screening.  If you are at high risk for breast cancer, talk with your health care provider about having an MRI and a mammogram every year.  Breast cancer (BRCA) gene test is recommended for women who have family members with BRCA-related cancers. Results of the assessment will determine the need for genetic counseling and BRCA1 and for BRCA2 testing. BRCA-related cancers include these types: ? Breast. This occurs in males or females. ? Ovarian. ? Tubal. This may also be called fallopian tube cancer. ?  Cancer of the  abdominal or pelvic lining (peritoneal cancer). ? Prostate. ? Pancreatic.  Cervical, Uterine, and Ovarian Cancer Your health care provider may recommend that you be screened regularly for cancer of the pelvic organs. These include your ovaries, uterus, and vagina. This screening involves a pelvic exam, which includes checking for microscopic changes to the surface of your cervix (Pap test).  For women ages 21-65, health care providers may recommend a pelvic exam and a Pap test every three years. For women ages 43-65, they may recommend the Pap test and pelvic exam, combined with testing for human papilloma virus (HPV), every five years. Some types of HPV increase your risk of cervical cancer. Testing for HPV may also be done on women of any age who have unclear Pap test results.  Other health care providers may not recommend any screening for nonpregnant women who are considered low risk for pelvic cancer and have no symptoms. Ask your health care provider if a screening pelvic exam is right for you.  If you have had past treatment for cervical cancer or a condition that could lead to cancer, you need Pap tests and screening for cancer for at least 20 years after your treatment. If Pap tests have been discontinued for you, your risk factors (such as having a new sexual partner) need to be reassessed to determine if you should start having screenings again. Some women have medical problems that increase the chance of getting cervical cancer. In these cases, your health care provider may recommend that you have screening and Pap tests more often.  If you have a family history of uterine cancer or ovarian cancer, talk with your health care provider about genetic screening.  If you have vaginal bleeding after reaching menopause, tell your health care provider.  There are currently no reliable tests available to screen for ovarian cancer.  Lung Cancer Lung cancer screening is recommended for adults  59-66 years old who are at high risk for lung cancer because of a history of smoking. A yearly low-dose CT scan of the lungs is recommended if you:  Currently smoke.  Have a history of at least 30 pack-years of smoking and you currently smoke or have quit within the past 15 years. A pack-year is smoking an average of one pack of cigarettes per day for one year.  Yearly screening should:  Continue until it has been 15 years since you quit.  Stop if you develop a health problem that would prevent you from having lung cancer treatment.  Colorectal Cancer  This type of cancer can be detected and can often be prevented.  Routine colorectal cancer screening usually begins at age 43 and continues through age 64.  If you have risk factors for colon cancer, your health care provider may recommend that you be screened at an earlier age.  If you have a family history of colorectal cancer, talk with your health care provider about genetic screening.  Your health care provider may also recommend using home test kits to check for hidden blood in your stool.  A small camera at the end of a tube can be used to examine your colon directly (sigmoidoscopy or colonoscopy). This is done to check for the earliest forms of colorectal cancer.  Direct examination of the colon should be repeated every 5-10 years until age 67. However, if early forms of precancerous polyps or small growths are found or if you have a family history or genetic risk for colorectal cancer,  you may need to be screened more often.  Skin Cancer  Check your skin from head to toe regularly.  Monitor any moles. Be sure to tell your health care provider: ? About any new moles or changes in moles, especially if there is a change in a mole's shape or color. ? If you have a mole that is larger than the size of a pencil eraser.  If any of your family members has a history of skin cancer, especially at a young age, talk with your health  care provider about genetic screening.  Always use sunscreen. Apply sunscreen liberally and repeatedly throughout the day.  Whenever you are outside, protect yourself by wearing long sleeves, pants, a wide-brimmed hat, and sunglasses.  What should I know about osteoporosis? Osteoporosis is a condition in which bone destruction happens more quickly than new bone creation. After menopause, you may be at an increased risk for osteoporosis. To help prevent osteoporosis or the bone fractures that can happen because of osteoporosis, the following is recommended:  If you are 52-80 years old, get at least 1,000 mg of calcium and at least 600 mg of vitamin D per day.  If you are older than age 99 but younger than age 34, get at least 1,200 mg of calcium and at least 600 mg of vitamin D per day.  If you are older than age 22, get at least 1,200 mg of calcium and at least 800 mg of vitamin D per day.  Smoking and excessive alcohol intake increase the risk of osteoporosis. Eat foods that are rich in calcium and vitamin D, and do weight-bearing exercises several times each week as directed by your health care provider. What should I know about how menopause affects my mental health? Depression may occur at any age, but it is more common as you become older. Common symptoms of depression include:  Low or sad mood.  Changes in sleep patterns.  Changes in appetite or eating patterns.  Feeling an overall lack of motivation or enjoyment of activities that you previously enjoyed.  Frequent crying spells.  Talk with your health care provider if you think that you are experiencing depression. What should I know about immunizations? It is important that you get and maintain your immunizations. These include:  Tetanus, diphtheria, and pertussis (Tdap) booster vaccine.  Influenza every year before the flu season begins.  Pneumonia vaccine.  Shingles vaccine.  Your health care provider may also  recommend other immunizations. This information is not intended to replace advice given to you by your health care provider. Make sure you discuss any questions you have with your health care provider. Document Released: 03/31/2005 Document Revised: 08/27/2015 Document Reviewed: 11/10/2014 Elsevier Interactive Patient Education  2018 [place]. Pneumococcal Conjugate Vaccine suspension for injection What is this medicine? PNEUMOCOCCAL VACCINE (NEU mo KOK al vak SEEN) is a vaccine used to prevent pneumococcus bacterial infections. These bacteria can cause serious infections like pneumonia, meningitis, and blood infections. This vaccine will lower your chance of getting pneumonia. If you do get pneumonia, it can make your symptoms milder and your illness shorter. This vaccine will not treat an infection and will not cause infection. This vaccine is recommended for infants and young children, adults with certain medical conditions, and adults 42 years or older. This medicine may be used for other purposes; ask your health care provider or pharmacist if you have questions. COMMON BRAND NAME(S): Prevnar, Prevnar 13 What should I tell my health care provider  before I take this medicine? They need to know if you have any of these conditions: -bleeding problems -fever -immune system problems -an unusual or allergic reaction to pneumococcal vaccine, diphtheria toxoid, other vaccines, latex, other medicines, foods, dyes, or preservatives -pregnant or trying to get pregnant -breast-feeding How should I use this medicine? This vaccine is for injection into a muscle. It is given by a health care professional. A copy of Vaccine Information Statements will be given before each vaccination. Read this sheet carefully each time. The sheet may change frequently. Talk to your pediatrician regarding the use of this medicine in children. While this drug may be prescribed for children as young as 15 weeks old for  selected conditions, precautions do apply. Overdosage: If you think you have taken too much of this medicine contact a poison control center or emergency room at once. NOTE: This medicine is only for you. Do not share this medicine with others. What if I miss a dose? It is important not to miss your dose. Call your doctor or health care professional if you are unable to keep an appointment. What may interact with this medicine? -medicines for cancer chemotherapy -medicines that suppress your immune function -steroid medicines like prednisone or cortisone This list may not describe all possible interactions. Give your health care provider a list of all the medicines, herbs, non-prescription drugs, or dietary supplements you use. Also tell them if you smoke, drink alcohol, or use illegal drugs. Some items may interact with your medicine. What should I watch for while using this medicine? Mild fever and pain should go away in 3 days or less. Report any unusual symptoms to your doctor or health care professional. What side effects may I notice from receiving this medicine? Side effects that you should report to your doctor or health care professional as soon as possible: -allergic reactions like skin rash, itching or hives, swelling of the face, lips, or tongue -breathing problems -confused -fast or irregular heartbeat -fever over 102 degrees F -seizures -unusual bleeding or bruising -unusual muscle weakness Side effects that usually do not require medical attention (report to your doctor or health care professional if they continue or are bothersome): -aches and pains -diarrhea -fever of 102 degrees F or less -headache -irritable -loss of appetite -pain, tender at site where injected -trouble sleeping This list may not describe all possible side effects. Call your doctor for medical advice about side effects. You may report side effects to FDA at 1-800-FDA-1088. Where should I keep my  medicine? This does not apply. This vaccine is given in a clinic, pharmacy, doctor's office, or other health care setting and will not be stored at home. NOTE: This sheet is a summary. It may not cover all possible information. If you have questions about this medicine, talk to your doctor, pharmacist, or health care provider.  2018 Elsevier/Gold Standard (2013-11-13 10:27:27)  Jaw Range of Motion Exercises Jaw range of motion exercises are exercises that help your jaw to move better. These exercises can help to prevent:  Difficulty opening your mouth.  Pain in your jaw while it is both open and closed.  What should I be careful of when doing jaw exercises? Make sure that you only do jaw exercises as directed by your health care provider. You should only move your jaw as far as it can go in each direction, if told to do so by your health care provider. Do not move your jaw into positions that cause you any  pain. What exercises should I do?  Stick your jaw forward. Hold this position for 1-2 seconds. Allow your jaw to return to its normal position and rest it there for 1-2 seconds. Do this exercise 8 times.  Stand or sit in front of a mirror. Place your tongue on the roof of your mouth, just behind your top teeth. Slowly open and close your jaw, keeping your tongue on the roof of your mouth. While you open and close your mouth, try to keep your jaw from moving toward one side or the other. Repeat this 8 times.  Move your jaw right. Hold this position for 1-2 seconds. Allow your jaw to return to its normal position, and rest it there for 1-2 seconds. Do this exercise 8 times.  Move your jaw left. Hold this position for 1-2 seconds. Allow your jaw to return to its normal position, and rest it there for 1-2 seconds. Do this exercise 8 times.  Open your mouth as far as it is can comfortably go. Hold this position for 1-2 seconds. Then close your mouth and rest for 1-2 seconds. Do this exercise 8  times.  Move your jaw in a circular motion, starting toward the right (clockwise). Repeat this 8 times.  Move your jaw in a circular motion, starting toward the left (counterclockwise). Repeat this 8 times. Apply moist heat packs or ice packs to your jaw before or after performing your exercises as directed by your health care provider. What else can I do? Avoid the following, if they cause jaw pain or they increase your jaw pain:  Chewing gum.  Clenching your jaw or teeth or keeping tension in your jaw muscles.  Leaning on your jaw, such as resting your jaw in your hand while leaning on a desk.  This information is not intended to replace advice given to you by your health care provider. Make sure you discuss any questions you have with your health care provider. Document Released: 01/20/2008 Document Revised: 07/15/2015 Document Reviewed: 01/07/2014 Elsevier Interactive Patient Education  [employer].

## 2017-01-02 NOTE — Assessment & Plan Note (Signed)
Not interested in medication. Will check levels today. Await results.

## 2017-01-03 ENCOUNTER — Encounter: Payer: Self-pay | Admitting: Family Medicine

## 2017-01-03 LAB — LIPID PANEL W/O CHOL/HDL RATIO
Cholesterol, Total: 307 mg/dL — ABNORMAL HIGH (ref 100–199)
HDL: 81 mg/dL (ref >39)
LDL CALC: 203 mg/dL — AB (ref 0–99)
Triglycerides: 115 mg/dL (ref 0–149)
VLDL CHOLESTEROL CAL: 23 mg/dL (ref 5–40)

## 2017-01-03 LAB — CBC WITH DIFFERENTIAL/PLATELET
BASOS: 0 %
Basophils Absolute: 0 10*3/uL (ref 0.0–0.2)
EOS (ABSOLUTE): 0 10*3/uL (ref 0.0–0.4)
EOS: 1 %
HEMATOCRIT: 42.3 % (ref 34.0–46.6)
HEMOGLOBIN: 14 g/dL (ref 11.1–15.9)
Immature Grans (Abs): 0 10*3/uL (ref 0.0–0.1)
Immature Granulocytes: 0 %
LYMPHS ABS: 1.8 10*3/uL (ref 0.7–3.1)
Lymphs: 22 %
MCH: 31 pg (ref 26.6–33.0)
MCHC: 33.1 g/dL (ref 31.5–35.7)
MCV: 94 fL (ref 79–97)
MONOCYTES: 7 %
MONOS ABS: 0.5 10*3/uL (ref 0.1–0.9)
NEUTROS ABS: 5.5 10*3/uL (ref 1.4–7.0)
Neutrophils: 70 %
Platelets: 147 10*3/uL — ABNORMAL LOW (ref 150–379)
RBC: 4.51 x10E6/uL (ref 3.77–5.28)
RDW: 14.3 % (ref 12.3–15.4)
WBC: 7.9 10*3/uL (ref 3.4–10.8)

## 2017-01-03 LAB — COMPREHENSIVE METABOLIC PANEL
A/G RATIO: 1.9 (ref 1.2–2.2)
ALBUMIN: 4.4 g/dL (ref 3.6–4.8)
ALK PHOS: 58 IU/L (ref 39–117)
ALT: 15 IU/L (ref 0–32)
AST: 19 IU/L (ref 0–40)
BUN/Creatinine Ratio: 17 (ref 12–28)
BUN: 13 mg/dL (ref 8–27)
Bilirubin Total: 0.3 mg/dL (ref 0.0–1.2)
CHLORIDE: 103 mmol/L (ref 96–106)
CO2: 28 mmol/L (ref 20–29)
CREATININE: 0.76 mg/dL (ref 0.57–1.00)
Calcium: 9.4 mg/dL (ref 8.7–10.3)
GFR, EST AFRICAN AMERICAN: 95 mL/min/{1.73_m2} (ref >59)
GFR, EST NON AFRICAN AMERICAN: 83 mL/min/{1.73_m2} (ref >59)
GLOBULIN, TOTAL: 2.3 g/dL (ref 1.5–4.5)
Glucose: 83 mg/dL (ref 65–99)
Potassium: 4.1 mmol/L (ref 3.5–5.2)
Sodium: 143 mmol/L (ref 134–144)
Total Protein: 6.7 g/dL (ref 6.0–8.5)

## 2017-01-03 LAB — TSH: TSH: 1.91 u[IU]/mL (ref 0.450–4.500)

## 2017-01-03 LAB — SEDIMENTATION RATE: Sed Rate: 4 mm/hr (ref 0–40)

## 2017-02-08 ENCOUNTER — Encounter: Payer: Self-pay | Admitting: Family Medicine

## 2017-02-08 ENCOUNTER — Telehealth: Payer: Self-pay | Admitting: Family Medicine

## 2017-02-08 ENCOUNTER — Ambulatory Visit
Admission: RE | Admit: 2017-02-08 | Discharge: 2017-02-08 | Disposition: A | Discharge status: Home / Self Care | Payer: Medicare HMO | Source: Ambulatory Visit | Attending: Family Medicine | Admitting: Family Medicine

## 2017-02-08 DIAGNOSIS — M810 Age-related osteoporosis without current pathological fracture: Secondary | ICD-10-CM | POA: Diagnosis not present

## 2017-02-08 DIAGNOSIS — Z13820 Encounter for screening for osteoporosis: Secondary | ICD-10-CM

## 2017-02-08 DIAGNOSIS — Z1231 Encounter for screening mammogram for malignant neoplasm of breast: Secondary | ICD-10-CM | POA: Insufficient documentation

## 2017-02-08 DIAGNOSIS — Z780 Asymptomatic menopausal state: Secondary | ICD-10-CM | POA: Diagnosis not present

## 2017-02-08 DIAGNOSIS — Z1239 Encounter for other screening for malignant neoplasm of breast: Secondary | ICD-10-CM

## 2017-02-08 NOTE — Telephone Encounter (Signed)
Called patient to let her know that her bone density came back showing osteoporosis. I need to know if she has ever been on medicine for her bone density in the past before. If she hasn't I'll send her through some medicine to keep her bones strong. OK to give her this message if she calls back.

## 2017-02-09 MED ORDER — ALENDRONATE SODIUM 70 MG PO TABS: 70.0000 mg | ORAL_TABLET | ORAL | 11 refills | Status: DC

## 2017-02-09 NOTE — Telephone Encounter (Signed)
Tiff- can you try her again?

## 2017-02-09 NOTE — Telephone Encounter (Signed)
Rx sent to her pharmacy 

## 2017-02-09 NOTE — Telephone Encounter (Signed)
Patient has not been on any bone medication [facility]

## 2017-03-05 ENCOUNTER — Telehealth: Payer: Self-pay | Admitting: Family Medicine

## 2017-03-05 NOTE — Telephone Encounter (Signed)
Copied from CRM [id number]. Topic: Quick Communication - See Telephone Encounter >> Mar 05, 2017  1:43 PM Arlyss Gandyichardson, Edge Mauger N, NT wrote: CRM for notification. See Telephone encounter for: Pt calling and would like to speak with Dr. Laural BenesJohnson about her osteoporosis. Please advise  03/05/17.

## 2017-03-05 NOTE — Telephone Encounter (Signed)
Routing to provider  

## 2017-03-05 NOTE — Telephone Encounter (Signed)
Called patient about her osteoporosis. Discussed with her. She notes that her R hip has been acting up with it feeling like it's going to give out on her. Offered x-ray, she declined right now. No other concerns. Call with any issues.

## 2017-05-28 DIAGNOSIS — H524 Presbyopia: Secondary | ICD-10-CM | POA: Diagnosis not present

## 2017-07-26 ENCOUNTER — Telehealth: Payer: Self-pay

## 2017-07-26 ENCOUNTER — Other Ambulatory Visit: Payer: Self-pay | Admitting: Family Medicine

## 2017-07-26 MED ORDER — ALENDRONATE SODIUM 70 MG PO TABS: 70.0000 mg | ORAL_TABLET | ORAL | 4 refills | Status: DC

## 2017-07-26 NOTE — Telephone Encounter (Signed)
Request for [facility]endronate 70mg  [facility]

## 2017-07-27 NOTE — Telephone Encounter (Signed)
Tried to submit a PA for fosamax generic, states that it Available without authorization

## 2017-10-24 ENCOUNTER — Telehealth: Payer: Self-pay | Admitting: Family Medicine

## 2017-10-24 DIAGNOSIS — M810 Age-related osteoporosis without current pathological fracture: Secondary | ICD-10-CM

## 2017-10-24 NOTE — Telephone Encounter (Signed)
Copied from CRM [id number]. Topic: Quick Communication - See Telephone Encounter >> Oct 24, 2017 12:38 PM Arlyss Gandy, NT wrote: CRM for notification. See Telephone encounter for: 10/24/17. Pt requesting to speak with Dr. Laural Benes regarding the alendronate (FOSAMAX) 70 MG tablet.

## 2017-10-25 ENCOUNTER — Other Ambulatory Visit: Payer: Self-pay | Admitting: Family Medicine

## 2017-10-25 DIAGNOSIS — M810 Age-related osteoporosis without current pathological fracture: Secondary | ICD-10-CM

## 2017-10-25 NOTE — Telephone Encounter (Signed)
Pt.notified

## 2017-10-25 NOTE — Telephone Encounter (Signed)
Please find out what questions she has about her fosmax.

## 2017-10-25 NOTE — Telephone Encounter (Signed)
Spoke with patient and she states that she does not like alendronate (Fosamax) at all. Pt states she has been taking it once a week and right after she started taking this med, she started feeling cramps, chest pain, dizziness and pain all over her body. States sometimes nauseous.

## 2017-10-25 NOTE — Telephone Encounter (Signed)
We will get her into see the doctor to discuss annual infusion rather than fosamax.

## 2017-11-07 DIAGNOSIS — M810 Age-related osteoporosis without current pathological fracture: Secondary | ICD-10-CM | POA: Diagnosis not present

## 2017-12-10 ENCOUNTER — Ambulatory Visit (INDEPENDENT_AMBULATORY_CARE_PROVIDER_SITE_OTHER): Payer: Medicare HMO

## 2017-12-10 DIAGNOSIS — Z23 Encounter for immunization: Secondary | ICD-10-CM

## 2018-01-03 ENCOUNTER — Telehealth: Payer: Self-pay | Admitting: Family Medicine

## 2018-01-03 NOTE — Telephone Encounter (Signed)
Copied from CRM [id number]. Topic: Quick Communication - Rx Refill/Question >> Jan 03, 2018 12:14 PM Baldo DaubAlexander, Amber L wrote: Medication:  alendronate (FOSAMAX) 70 MG tablet  Pt states she has stopped this medication.  States that [facility] to [facility] her about this.  She wants it taken off of her medication list.

## 2018-01-03 NOTE — Telephone Encounter (Signed)
Patient called, left VM to return call to discuss removal of Fosamax from profile.

## 2018-01-03 NOTE — Telephone Encounter (Signed)
Rx removed- please call pharmacy to cancel it.

## 2018-01-03 NOTE — Telephone Encounter (Signed)
Cancelled RX w/ [facility].

## 2018-01-09 ENCOUNTER — Other Ambulatory Visit: Payer: Self-pay | Admitting: Family Medicine

## 2018-01-09 DIAGNOSIS — Z1231 Encounter for screening mammogram for malignant neoplasm of breast: Secondary | ICD-10-CM

## 2018-02-11 ENCOUNTER — Ambulatory Visit
Admission: RE | Admit: 2018-02-11 | Discharge: 2018-02-11 | Disposition: A | Discharge status: Home / Self Care | Payer: Medicare HMO | Source: Ambulatory Visit | Attending: Family Medicine | Admitting: Family Medicine

## 2018-02-11 DIAGNOSIS — Z1231 Encounter for screening mammogram for malignant neoplasm of breast: Secondary | ICD-10-CM

## 2018-02-11 DIAGNOSIS — M810 Age-related osteoporosis without current pathological fracture: Secondary | ICD-10-CM | POA: Diagnosis not present

## 2018-02-28 ENCOUNTER — Other Ambulatory Visit: Payer: Self-pay

## 2018-02-28 ENCOUNTER — Ambulatory Visit
Admission: RE | Admit: 2018-02-28 | Discharge: 2018-02-28 | Disposition: A | Discharge status: Home / Self Care | Payer: Medicare HMO | Source: Ambulatory Visit | Attending: Family Medicine | Admitting: Family Medicine

## 2018-02-28 ENCOUNTER — Ambulatory Visit (INDEPENDENT_AMBULATORY_CARE_PROVIDER_SITE_OTHER): Payer: Medicare HMO | Admitting: Family Medicine

## 2018-02-28 ENCOUNTER — Ambulatory Visit (INDEPENDENT_AMBULATORY_CARE_PROVIDER_SITE_OTHER): Payer: Medicare HMO

## 2018-02-28 ENCOUNTER — Encounter: Payer: Self-pay | Admitting: Family Medicine

## 2018-02-28 VITALS — BP 108/70 | HR 67 | Temp 98.0°F | Ht 61.0 in | Wt 147.0 lb

## 2018-02-28 VITALS — BP 135/92 | HR 95 | Temp 97.9°F | Ht 62.0 in | Wt 147.2 lb

## 2018-02-28 DIAGNOSIS — M545 Low back pain, unspecified: Secondary | ICD-10-CM

## 2018-02-28 DIAGNOSIS — R0781 Pleurodynia: Secondary | ICD-10-CM | POA: Diagnosis not present

## 2018-02-28 DIAGNOSIS — Z0000 Encounter for general adult medical examination without abnormal findings: Secondary | ICD-10-CM

## 2018-02-28 DIAGNOSIS — Z23 Encounter for immunization: Secondary | ICD-10-CM

## 2018-02-28 LAB — UA/M W/RFLX CULTURE, ROUTINE
Bilirubin, UA: NEGATIVE
GLUCOSE, UA: NEGATIVE
Ketones, UA: NEGATIVE
LEUKOCYTES UA: NEGATIVE
Nitrite, UA: NEGATIVE
PH UA: 5.5 (ref 5.0–7.5)
Protein, UA: NEGATIVE
RBC, UA: NEGATIVE
Specific Gravity, UA: 1.025 (ref 1.005–1.030)
Urobilinogen, Ur: 0.2 mg/dL (ref 0.2–1.0)

## 2018-02-28 MED ORDER — PREDNISONE 10 MG PO TABS: ORAL_TABLET | ORAL | 0 refills | Status: DC

## 2018-02-28 NOTE — Patient Instructions (Signed)
Heather Walsh , Thank you for taking time to come for your Medicare Wellness Visit. I appreciate your ongoing commitment to your health goals. Please review the following plan we discussed and let me know if I can assist you in the future.   Screening recommendations/referrals: Colonoscopy: Up to date, next due 12/2014 Mammogram: Up to date, next due 01/2019 Bone Density: Up to date Recommended yearly ophthalmology/optometry visit for glaucoma screening and checkup Recommended yearly dental visit for hygiene and checkup  Vaccinations: Influenza vaccine: Up to date Pneumococcal vaccine: Due for peumovax 23, administered today Tdap vaccine: Up to date, next due 10/2024 Shingles vaccine: Due    Advanced directives: Advance directive discussed with you today. I have provided a copy for you to complete at home and have notarized. Once this is complete please bring a copy in to our office so we can scan it into your chart.  Conditions/risks identified: Overweight, recommend starting a routine exercise program at least 3 days a week for 30-45 minutes at a time as tolerated.    Next appointment: Follow up in 1 year for your annual wellness visit.    Preventive Care 17 Years and Older, Female Preventive care refers to lifestyle choices and visits with your health care provider that can promote health and wellness. What does preventive care include?  A yearly physical exam. This is also called an annual well check.  Dental exams once or twice a year.  Routine eye exams. Ask your health care provider how often you should have your eyes checked.  Personal lifestyle choices, including:  Daily care of your teeth and gums.  Regular physical activity.  Eating a healthy diet.  Avoiding tobacco and drug use.  Limiting alcohol use.  Practicing safe sex.  Taking low-dose aspirin every day.  Taking vitamin and mineral supplements as recommended by your health care provider. What happens  during an annual well check? The services and screenings done by your health care provider during your annual well check will depend on your age, overall health, lifestyle risk factors, and family history of disease. Counseling  Your health care provider may ask you questions about your:  Alcohol use.  Tobacco use.  Drug use.  Emotional well-being.  Home and relationship well-being.  Sexual activity.  Eating habits.  History of falls.  Memory and ability to understand (cognition).  Work and work Astronomer.  Reproductive health. Screening  You may have the following tests or measurements:  Height, weight, and BMI.  Blood pressure.  Lipid and cholesterol levels. These may be checked every 5 years, or more frequently if you are over 59 years old.  Skin check.  Lung cancer screening. You may have this screening every year starting at age 71 if you have a 30-pack-year history of smoking and currently smoke or have quit within the past 15 years.  Fecal occult blood test (FOBT) of the stool. You may have this test every year starting at age 38.  Flexible sigmoidoscopy or colonoscopy. You may have a sigmoidoscopy every 5 years or a colonoscopy every 10 years starting at age 41.  Hepatitis C blood test.  Hepatitis B blood test.  Sexually transmitted disease (STD) testing.  Diabetes screening. This is done by checking your blood sugar (glucose) after you have not eaten for a while (fasting). You may have this done every 1-3 years.  Bone density scan. This is done to screen for osteoporosis. You may have this done starting at age 48.  Mammogram. This  may be done every 1-2 years. Talk to your health care provider about how often you should have regular mammograms. Talk with your health care provider about your test results, treatment options, and if necessary, the need for more tests. Vaccines  Your health care provider may recommend certain vaccines, such  as:  Influenza vaccine. This is recommended every year.  Tetanus, diphtheria, and acellular pertussis (Tdap, Td) vaccine. You may need a Td booster every 10 years.  Zoster vaccine. You may need this after age 18.  Pneumococcal 13-valent conjugate (PCV13) vaccine. One dose is recommended after age 40.  Pneumococcal polysaccharide (PPSV23) vaccine. One dose is recommended after age 75. Talk to your health care provider about which screenings and vaccines you need and how often you need them. This information is not intended to replace advice given to you by your health care provider. Make sure you discuss any questions you have with your health care provider. Document Released: 03/05/2015 Document Revised: 10/27/2015 Document Reviewed: 12/08/2014 Elsevier Interactive Patient Education  2017 [place] Prevention in the Home Falls can cause injuries. They can happen to people of all ages. There are many things you can do to make your home safe and to help prevent falls. What can I do on the outside of my home?  Regularly fix the edges of walkways and driveways and fix any cracks.  Remove anything that might make you trip as you walk through a door, such as a raised step or threshold.  Trim any bushes or trees on the path to your home.  Use bright outdoor lighting.  Clear any walking paths of anything that might make someone trip, such as rocks or tools.  Regularly check to see if handrails are loose or broken. Make sure that both sides of any steps have handrails.  Any raised decks and porches should have guardrails on the edges.  Have any leaves, snow, or ice cleared regularly.  Use sand or salt on walking paths during winter.  Clean up any spills in your garage right away. This includes oil or grease spills. What can I do in the bathroom?  Use night lights.  Install grab bars by the toilet and in the tub and shower. Do not use towel bars as grab bars.  Use  non-skid mats or decals in the tub or shower.  If you need to sit down in the shower, use a plastic, non-slip stool.  Keep the floor dry. Clean up any water that spills on the floor as soon as it happens.  Remove soap buildup in the tub or shower regularly.  Attach bath mats securely with double-sided non-slip rug tape.  Do not have throw rugs and other things on the floor that can make you trip. What can I do in the bedroom?  Use night lights.  Make sure that you have a light by your bed that is easy to reach.  Do not use any sheets or blankets that are too big for your bed. They should not hang down onto the floor.  Have a firm chair that has side arms. You can use this for support while you get dressed.  Do not have throw rugs and other things on the floor that can make you trip. What can I do in the kitchen?  Clean up any spills right away.  Avoid walking on wet floors.  Keep items that you use a lot in easy-to-reach places.  If you need to reach something above  you, use a strong step stool that has a grab bar.  Keep electrical cords out of the way.  Do not use floor polish or wax that makes floors slippery. If you must use wax, use non-skid floor wax.  Do not have throw rugs and other things on the floor that can make you trip. What can I do with my stairs?  Do not leave any items on the stairs.  Make sure that there are handrails on both sides of the stairs and use them. Fix handrails that are broken or loose. Make sure that handrails are as long as the stairways.  Check any carpeting to make sure that it is firmly attached to the stairs. Fix any carpet that is loose or worn.  Avoid having throw rugs at the top or bottom of the stairs. If you do have throw rugs, attach them to the floor with carpet tape.  Make sure that you have a light switch at the top of the stairs and the bottom of the stairs. If you do not have them, ask someone to add them for you. What  else can I do to help prevent falls?  Wear shoes that:  Do not have high heels.  Have rubber bottoms.  Are comfortable and fit you well.  Are closed at the toe. Do not wear sandals.  If you use a stepladder:  Make sure that it is fully opened. Do not climb a closed stepladder.  Make sure that both sides of the stepladder are locked into place.  Ask someone to hold it for you, if possible.  Clearly mark and make sure that you can see:  Any grab bars or handrails.  First and last steps.  Where the edge of each step is.  Use tools that help you move around (mobility aids) if they are needed. These include:  Canes.  Walkers.  Scooters.  Crutches.  Turn on the lights when you go into a dark area. Replace any light bulbs as soon as they burn out.  Set up your furniture so you have a clear path. Avoid moving your furniture around.  If any of your floors are uneven, fix them.  If there are any pets around you, be aware of where they are.  Review your medicines with your doctor. Some medicines can make you feel dizzy. This can increase your chance of falling. Ask your doctor what other things that you can do to help prevent falls. This information is not intended to replace advice given to you by your health care provider. Make sure you discuss any questions you have with your health care provider. Document Released: 12/03/2008 Document Revised: 07/15/2015 Document Reviewed: 03/13/2014 Elsevier Interactive Patient Education  2017 [employer].

## 2018-02-28 NOTE — Progress Notes (Signed)
BP 108/70   Pulse 67   Temp 98 F (36.7 C) (Oral)   Ht 5\' 1"  (1.549 m)   Wt 147 lb (66.7 kg)   SpO2 98%   BMI 27.78 kg/m    Subjective:    Patient ID: Heather Walsh, female    DOB: 12-20-1950, 68 y.o.   MRN: [id number]  HPI: Heather Walsh is a 68 y.o. female  Chief Complaint  Patient presents with  . Back Pain    pt states waist area all the way aroundn for over a week  . Left side pain    waist up   3 weeks of left rib pain and "fullness", back soreness, and b/l hip pain that's been worsening the past week. Has been to the Chirpor[facility] last week and got an adjustment but still hurting. The pain is radiating into ribs, having nausea intermittently as well. Using heat and ice which helps temporarily. Pain is worse with movement. Taking occasional tylenol with minimal relief. Has a hx of left rib fracture that healed without issue in the past. Denies weight loss, fevers, night sweats, vomiting, bowel changes, recent injury, joint swelling, tick bites, vaginal discharge, urinary sxs.   Past Medical History:  Diagnosis Date  . Hyperlipidemia    Social History   Socioeconomic History  . Marital status: Divorced    Spouse name: Not on file  . Number of children: 2  . Years of education: Not on file  . Highest education level: Not on file  Occupational History  . Not on file  Social Needs  . Financial resource strain: Not hard at all  . Food insecurity:    Worry: Never true    Inability: Never true  . Transportation needs:    Medical: No    Non-medical: No  Tobacco Use  . Smoking status: Former Smoker    Packs/day: 1.00    Years: 20.00    Pack years: 20.00    Types: Cigarettes    Last attempt to quit: 06/02/1994    Years since quitting: 23.7  . Smokeless tobacco: Never Used  Substance and Sexual Activity  . Alcohol use: No  . Drug use: Yes    Types: Marijuana  . Sexual activity: Not Currently  Lifestyle  . Physical activity:    Days per week: 0 days   Minutes per session: 0 min  . Stress: Not at all  Relationships  . Social connections:    Talks on phone: More than three times a week    Gets together: Twice a week    Attends religious service: Never    Active member of club or organization: No    Attends meetings of clubs or organizations: Never    Relationship status: Divorced  . Intimate partner violence:    Fear of current or ex partner: No    Emotionally abused: No    Physically abused: Not on file    Forced sexual activity: No  Other Topics Concern  . Not on file  Social History Narrative  . Not on file    Relevant past medical, surgical, family and social history reviewed and updated as indicated. Interim medical history since our last visit reviewed. Allergies and medications reviewed and updated.  Review of Systems  Per HPI unless specifically indicated above     Objective:    BP 108/70   Pulse 67   Temp 98 F (36.7 C) (Oral)   Ht 5\' 1"  (1.549 m)   Wt 147  lb (66.7 kg)   SpO2 98%   BMI 27.78 kg/m   Wt Readings from Last 3 Encounters:  02/28/18 147 lb (66.7 kg)  02/28/18 147 lb 3.2 oz (66.8 kg)  01/02/17 142 lb (64.4 kg)    Physical Exam Vitals signs and nursing note reviewed.  Constitutional:      Appearance: Normal appearance. She is not ill-appearing.  HENT:     Head: Atraumatic.     Mouth/Throat:     Mouth: Mucous membranes are moist.     Pharynx: Oropharynx is clear.  Eyes:     Extraocular Movements: Extraocular movements intact.     Conjunctiva/sclera: Conjunctivae normal.     Pupils: Pupils are equal, round, and reactive to light.  Neck:     Musculoskeletal: Normal range of motion and neck supple.  Cardiovascular:     Rate and Rhythm: Normal rate and regular rhythm.     Heart sounds: Normal heart sounds.  Pulmonary:     Effort: Pulmonary effort is normal. No respiratory distress.     Breath sounds: Normal breath sounds.  Abdominal:     Tenderness: There is no abdominal tenderness.  There is no right CVA tenderness, left CVA tenderness or guarding.  Musculoskeletal: Normal range of motion.        General: Tenderness (point tenderness left anterior ribs) present.     Comments: Low back nontender to palpation No reproduction of pain with resistance in all 4 extremities  Skin:    General: Skin is warm and dry.     Findings: No rash.  Neurological:     General: No focal deficit present.     Mental Status: She is alert and oriented to person, place, and time.     Sensory: No sensory deficit.     Motor: No weakness.     Coordination: Coordination normal.     Gait: Gait normal.  Psychiatric:        Mood and Affect: Mood normal.        Thought Content: Thought content normal.        Judgment: Judgment normal.     Results for orders placed or performed in visit on 02/28/18  UA/M w/rflx Culture, Routine  Result Value Ref Range   Specific Gravity, UA 1.025 1.005 - 1.030   pH, UA 5.5 5.0 - 7.5   Color, UA Yellow Yellow   Appearance Ur Clear Clear   Leukocytes, UA Negative Negative   Protein, UA Negative Negative/Trace   Glucose, UA Negative Negative   Ketones, UA Negative Negative   RBC, UA Negative Negative   Bilirubin, UA Negative Negative   Urobilinogen, Ur 0.2 0.2 - 1.0 mg/dL   Nitrite, UA Negative Negative      Assessment & Plan:   Problem List Items Addressed This Visit    None    Visit Diagnoses    Acute bilateral low back pain without sciatica    -  Primary   Relevant Medications   predniSONE (DELTASONE) 10 MG tablet   Other Relevant Orders   UA/M w/rflx Culture, Routine (Completed)   Rib pain on left side       Relevant Orders   DG Ribs Unilateral Left (Completed)    Unclear etiology for sxs, will obtain left rib films given point tenderness and hx of osteoporosis. U/A without evidence of UTI or hematuria, exam fairly benign other than rib ttp. Will start prednisone taper in case sxs are arthritic/inflammatory. Follow up in 1 week if not  improved for further workup.   Follow up plan: Return in about 1 week (around 03/07/2018) for recheck if no better.

## 2018-02-28 NOTE — Progress Notes (Signed)
Subjective:   Charlynn CourtKathleen Gopal is a 68 y.o. female who presents for Medicare Annual (Subsequent) preventive examination.  Review of Systems:  N/A Cardiac Risk Factors include: advanced age (>3955men, 36>65 women);dyslipidemia;sedentary lifestyle     Objective:     Vitals: BP (!) 135/92   Pulse 95   Temp 97.9 F (36.6 C) (Oral)   Ht 5\' 2"  (1.575 m)   Wt 147 lb 3.2 oz (66.8 kg)   SpO2 95%   BMI 26.92 kg/m   Body mass index is 26.92 kg/m.  Advanced Directives 02/28/2018  Does Patient Have a Medical Advance Directive? Yes  Type of Estate agentAdvance Directive Healthcare Power of [place];Living will  Does patient want to make changes to medical advance directive? No - Patient declined  Copy of Healthcare Power of Attorney in Chart? Yes - validated most recent copy scanned in chart (See row information)    Tobacco Social History   Tobacco Use  Smoking Status Former Smoker  . Packs/day: 1.00  . Years: 20.00  . Pack years: 20.00  . Types: Cigarettes  . Last attempt to quit: 06/02/1994  . Years since quitting: 23.7  Smokeless Tobacco Never Used     Counseling given: Not Answered   Clinical Intake:  Pre-visit preparation completed: Yes  Pain : 0-10 Pain Score: 7  Pain Type: Acute pain Pain Location: Back Pain Orientation: Lower Pain Radiating Towards: bilateral hips, left oblique region, and down right leg Pain Descriptors / Indicators: Constant, Burning, Tightness Pain Onset: 1 to 4 weeks ago Pain Frequency: Constant Pain Relieving Factors: Ice and heat Effect of Pain on Daily Activities: none   Pain Relieving Factors: Ice and heat  Nutritional Status: BMI 25 -29 Overweight Nutritional Risks: None Diabetes: No  How often do you need to have someone help you when you read instructions, pamphlets, or other written materials from your doctor or pharmacy?: 1 - Never What is the last grade level you completed in school?: 12th grade  Interpreter Needed?: No  Information  entered by :: Candis ShineKrystal Dondre Catalfamo, LPN  Past Medical History:  Diagnosis Date  . Hyperlipidemia    Past Surgical History:  Procedure Laterality Date  . CESAREAN SECTION     X 2  . COLONOSCOPY  01/01/2015  . DIAPHRAGMATIC HERNIA REPAIR  11/2010  . RIB FRACTURE SURGERY Left 07/2010   repair with fixation   Family History  Problem Relation Age of Onset  . CAD Mother   . Cancer Mother        lung  . Heart attack Father   . Heart disease Father   . Alcohol abuse Brother   . Cancer Brother        colon  . Cancer Son        bone  . Stroke Maternal Grandfather   . CAD Paternal Grandmother   . CAD Paternal Grandfather   . Cancer Sister        Throat  . Obesity Brother   . Alcohol abuse Son   . Breast cancer Neg Hx    Social History   Socioeconomic History  . Marital status: Divorced    Spouse name: Not on file  . Number of children: 2  . Years of education: Not on file  . Highest education level: Not on file  Occupational History  . Not on file  Social Needs  . Financial resource strain: Not hard at all  . Food insecurity:    Worry: Never true    Inability:  Never true  . Transportation needs:    Medical: No    Non-medical: No  Tobacco Use  . Smoking status: Former Smoker    Packs/day: 1.00    Years: 20.00    Pack years: 20.00    Types: Cigarettes    Last attempt to quit: 06/02/1994    Years since quitting: 23.7  . Smokeless tobacco: Never Used  Substance and Sexual Activity  . Alcohol use: No  . Drug use: Yes    Types: Marijuana  . Sexual activity: Not Currently  Lifestyle  . Physical activity:    Days per week: 0 days    Minutes per session: 0 min  . Stress: Not at all  Relationships  . Social connections:    Talks on phone: More than three times a week    Gets together: Twice a week    Attends religious service: Never    Active member of club or organization: No    Attends meetings of clubs or organizations: Never    Relationship status: Divorced    Other Topics Concern  . Not on file  Social History Narrative  . Not on file    Outpatient Encounter Medications as of 02/28/2018  Medication Sig  . Calcium-Magnesium-Vitamin D (CALCIUM 1200+D3 PO) Take 1 tablet by mouth daily.  . Magnesium 400 MG TABS Take 1 tablet by mouth daily.  . Red Yeast Rice 600 MG CAPS Take 2 capsules by mouth daily.  . [DISCONTINUED] albuterol (PROVENTIL HFA;VENTOLIN HFA) 108 (90 Base) MCG/ACT inhaler Inhale 2 puffs every 6 (six) hours as needed into the lungs for wheezing or shortness of breath.   No facility-administered encounter medications on file as of 02/28/2018.     Activities of Daily Living In your present state of health, do you have any difficulty performing the following activities: 02/28/2018  Hearing? N  Vision? N  Difficulty concentrating or making decisions? Y  Comment occasional forgetfulness  Walking or climbing stairs? N  Dressing or bathing? N  Doing errands, shopping? N  Preparing Food and eating ? N  Using the Toilet? N  In the past six months, have you accidently leaked urine? N  Do you have problems with loss of bowel control? N  Managing your Medications? N  Managing your Finances? N  Housekeeping or managing your Housekeeping? N  Some recent data might be hidden    Patient Care Team: Dorcas Carrow, DO as PCP - General (Family Medicine)    Assessment:   This is a routine wellness examination for Tennelle.  Exercise Activities and Dietary recommendations Current Exercise Habits: The patient does not participate in regular exercise at present, Exercise limited by: None identified  Goals    . Exercise 3x per week (30 min per time)     Recommend starting a routine exercise program at least 3 days a week for 30-45 minutes at a time as tolerated.         Fall Risk Fall Risk  02/28/2018 01/02/2017 06/29/2016  Falls in the past year? 0 No No  Number falls in past yr: 0 - -   Is the patient's home free of loose throw  rugs in walkways, pet beds, electrical cords, etc?   yes      Grab bars in the bathroom? no      Handrails on the stairs?   no, does not have stairs in home      Adequate lighting?   yes  Depression Screen PHQ 2/9  Scores 02/28/2018 01/02/2017 06/29/2016  PHQ - 2 Score 0 0 0     Cognitive Function     6CIT Screen 02/28/2018 01/02/2017  What Year? - 0 points  What month? 0 points 0 points  What time? 0 points 0 points  Count back from 20 0 points 0 points  Months in reverse 0 points 0 points  Repeat phrase 0 points 4 points  Total Score - 4    Immunization History  Administered Date(s) Administered  . Influenza, High Dose Seasonal PF 12/10/2017  . Pneumococcal Conjugate-13 01/02/2017  . Pneumococcal Polysaccharide-23 02/28/2018  . Tdap 10/29/2014    Qualifies for Shingles Vaccine? Discussed options for Shingrix  Screening Tests Health Maintenance  Topic Date Due  . PNA vac Low Risk Adult (2 of 2 - PPSV23) 01/02/2018  . MAMMOGRAM  02/12/2020  . TETANUS/TDAP  10/28/2024  . COLONOSCOPY  12/31/2024  . INFLUENZA VACCINE  Completed  . DEXA SCAN  Completed  . Hepatitis C Screening  Addressed    Cancer Screenings: Lung: Low Dose CT Chest recommended if Age 59-80 years, 30 pack-year currently smoking OR have quit w/in 15years. Patient does not qualify. Breast:  Up to date on Mammogram? Yes   Up to date of Bone Density/Dexa? Yes Colorectal: Up to date  Additional Screenings: : Hepatitis C Screening:      Plan:      I have personally reviewed and noted the following in the patient's chart:   . Medical and social history . Use of alcohol, tobacco or illicit drugs  . Current medications and supplements . Functional ability and status . Nutritional status . Physical activity . Advanced directives . List of other physicians . Hospitalizations, surgeries, and ER visits in previous 12 months . Vitals . Screenings to include cognitive, depression, and falls . Referrals  and appointments  In addition, I have reviewed and discussed with patient certain preventive protocols, quality metrics, and best practice recommendations. A written personalized care plan for preventive services as well as general preventive health recommendations were provided to patient.     Candis Shine, LPN  09/26/5782

## 2018-03-01 ENCOUNTER — Telehealth: Payer: Self-pay | Admitting: Family Medicine

## 2018-03-01 NOTE — Telephone Encounter (Signed)
-----   Message from Richarda Overlie, [place] sent at 03/01/2018  9:10 AM EST ----- Pt requesting xray results

## 2018-03-01 NOTE — Telephone Encounter (Signed)
Please let her know that her x-ray was negative for any acute changes, just showing old healed fracture. Start on the prednisone and follow up next week with PCP to discuss next steps if not feeling better.

## 2018-03-01 NOTE — Telephone Encounter (Signed)
Message relayed to patient. Verbalized understanding and denied questions.   

## 2018-03-18 ENCOUNTER — Telehealth: Payer: Self-pay | Admitting: Family Medicine

## 2018-03-18 ENCOUNTER — Ambulatory Visit (INDEPENDENT_AMBULATORY_CARE_PROVIDER_SITE_OTHER): Payer: Medicare HMO | Admitting: Family Medicine

## 2018-03-18 ENCOUNTER — Encounter: Payer: Self-pay | Admitting: Family Medicine

## 2018-03-18 ENCOUNTER — Ambulatory Visit
Admission: RE | Admit: 2018-03-18 | Discharge: 2018-03-18 | Disposition: A | Discharge status: Home / Self Care | Payer: Medicare HMO | Source: Ambulatory Visit | Attending: Family Medicine | Admitting: Family Medicine

## 2018-03-18 VITALS — BP 128/81 | HR 76 | Temp 98.0°F | Wt 148.4 lb

## 2018-03-18 DIAGNOSIS — M25551 Pain in right hip: Secondary | ICD-10-CM

## 2018-03-18 DIAGNOSIS — M25552 Pain in left hip: Secondary | ICD-10-CM | POA: Insufficient documentation

## 2018-03-18 DIAGNOSIS — G8929 Other chronic pain: Secondary | ICD-10-CM

## 2018-03-18 DIAGNOSIS — M545 Low back pain, unspecified: Secondary | ICD-10-CM

## 2018-03-18 DIAGNOSIS — M810 Age-related osteoporosis without current pathological fracture: Secondary | ICD-10-CM

## 2018-03-18 DIAGNOSIS — M5136 Other intervertebral disc degeneration, lumbar region: Secondary | ICD-10-CM | POA: Diagnosis not present

## 2018-03-18 MED ORDER — LIDOCAINE 5 % EX PTCH: 1.0000 | MEDICATED_PATCH | CUTANEOUS | 3 refills | Status: DC

## 2018-03-18 NOTE — Telephone Encounter (Signed)
Please let her know that her x-rays showed a little bit of arthritis, but nothing terrible. i'm going to get her a referral to PT to help with her pain. Let me know if she has any questions.

## 2018-03-18 NOTE — Progress Notes (Signed)
BP 128/81   Pulse 76   Temp 98 F (36.7 C) (Oral)   Wt 148 lb 6.4 oz (67.3 kg)   SpO2 98%   BMI 28.04 kg/m    Subjective:    Patient ID: Heather Walsh, female    DOB: 09/21/1950, 68 y.o.   MRN: [id number]  HPI: Heather CourtKathleen Walsh is a 68 y.o. female  Chief Complaint  Patient presents with  . Back Pain    pt states her lower back and left side has been hurting for a month. States she took a Prednisone burst and has been to the chiroprator with no relief from either    BACK PAIN- low back bilaterally going from her hips from 1 to the other and then up her L ribs. She has tried prednisone and chiropractor and nothing helped Duration: about a month Mechanism of injury: no trauma Location: bilateral and low back Onset: sudden Severity: moderate Quality: constant ache Frequency: constant Radiation: into her ribs L Aggravating factors: nothing Alleviating factors: ice for a short period, heat for short period Status: stable Treatments attempted: rest, ice, heat and ibuprofen  Relief with NSAIDs?: no Nighttime pain:  no Paresthesias / decreased sensation:  no Bowel / bladder incontinence:  no Fevers:  no Dysuria / urinary frequency:  no  Relevant past medical, surgical, family and social history reviewed and updated as indicated. Interim medical history since our last visit reviewed. Allergies and medications reviewed and updated.  Review of Systems  Constitutional: Negative.   Respiratory: Negative.   Cardiovascular: Negative.   Musculoskeletal: Positive for arthralgias, back pain and myalgias. Negative for gait problem, joint swelling, neck pain and neck stiffness.  Skin: Negative.   Neurological: Negative.   Psychiatric/Behavioral: Negative.     Per HPI unless specifically indicated above     Objective:    BP 128/81   Pulse 76   Temp 98 F (36.7 C) (Oral)   Wt 148 lb 6.4 oz (67.3 kg)   SpO2 98%   BMI 28.04 kg/m   Wt Readings from Last 3 Encounters:    03/18/18 148 lb 6.4 oz (67.3 kg)  02/28/18 147 lb (66.7 kg)  02/28/18 147 lb 3.2 oz (66.8 kg)    Physical Exam Vitals signs and nursing note reviewed.  Constitutional:      General: She is not in acute distress.    Appearance: Normal appearance. She is not ill-appearing, toxic-appearing or diaphoretic.  HENT:     Head: Normocephalic and atraumatic.     Right Ear: External ear normal.     Left Ear: External ear normal.     Nose: Nose normal.     Mouth/Throat:     Mouth: Mucous membranes are moist.     Pharynx: Oropharynx is clear.  Eyes:     General: No scleral icterus.       Right eye: No discharge.        Left eye: No discharge.     Extraocular Movements: Extraocular movements intact.     Conjunctiva/sclera: Conjunctivae normal.     Pupils: Pupils are equal, round, and reactive to light.  Neck:     Musculoskeletal: Normal range of motion and neck supple.  Cardiovascular:     Rate and Rhythm: Normal rate and regular rhythm.     Pulses: Normal pulses.     Heart sounds: Normal heart sounds. No murmur. No friction rub. No gallop.   Pulmonary:     Effort: Pulmonary effort is normal. No respiratory  distress.     Breath sounds: Normal breath sounds. No stridor. No wheezing, rhonchi or rales.  Chest:     Chest wall: No tenderness.  Musculoskeletal: Normal range of motion.     Comments: No hypertonicity of her lumbar paraspinals, mild tenderness to palpation of her hips and sacrum.   Skin:    General: Skin is warm and dry.     Capillary Refill: Capillary refill takes less than 2 seconds.     Coloration: Skin is not jaundiced or pale.     Findings: No bruising, erythema, lesion or rash.  Neurological:     General: No focal deficit present.     Mental Status: She is alert and oriented to person, place, and time. Mental status is at baseline.  Psychiatric:        Mood and Affect: Mood normal.        Behavior: Behavior normal.        Thought Content: Thought content normal.         Judgment: Judgment normal.     Results for orders placed or performed in visit on 02/28/18  UA/M w/rflx Culture, Routine  Result Value Ref Range   Specific Gravity, UA 1.025 1.005 - 1.030   pH, UA 5.5 5.0 - 7.5   Color, UA Yellow Yellow   Appearance Ur Clear Clear   Leukocytes, UA Negative Negative   Protein, UA Negative Negative/Trace   Glucose, UA Negative Negative   Ketones, UA Negative Negative   RBC, UA Negative Negative   Bilirubin, UA Negative Negative   Urobilinogen, Ur 0.2 0.2 - 1.0 mg/dL   Nitrite, UA Negative Negative      Assessment & Plan:   Problem List Items Addressed This Visit      Musculoskeletal and Integument   Osteoporosis    Given Osteoporosis, will check x-rays to rule out compression fracture. Await results.       Relevant Orders   DG Lumbar Spine Complete   DG HIPS BILAT WITH PELVIS 3-4 VIEWS    Other Visit Diagnoses    Pain of both hip joints    -  Primary   Has been going on for a month and failing conservative therapy. Will obtain x-rays. Await results. Lidocaine patches to her pharmacy. Await results.   Relevant Orders   DG Lumbar Spine Complete   DG HIPS BILAT WITH PELVIS 3-4 VIEWS   Chronic bilateral low back pain without sciatica       Has been going on for a month and failing conservative therapy. Will obtain x-rays. Await results. Lidocaine patches to her pharmacy. Await results.   Relevant Orders   DG Lumbar Spine Complete   DG HIPS BILAT WITH PELVIS 3-4 VIEWS       Follow up plan: Return if symptoms worsen or fail to improve.

## 2018-03-18 NOTE — Assessment & Plan Note (Signed)
Given Osteoporosis, will check x-rays to rule out compression fracture. Await results.

## 2018-03-18 NOTE — Telephone Encounter (Signed)
Patient notified

## 2018-03-18 NOTE — Addendum Note (Signed)
Addended by: Dorcas Carrow on: 03/18/2018 10:59 AM   Modules accepted: Orders

## 2018-04-02 ENCOUNTER — Telehealth: Payer: Self-pay | Admitting: Family Medicine

## 2018-04-02 DIAGNOSIS — M25551 Pain in right hip: Secondary | ICD-10-CM

## 2018-04-02 DIAGNOSIS — M545 Low back pain, unspecified: Secondary | ICD-10-CM

## 2018-04-02 DIAGNOSIS — G8929 Other chronic pain: Secondary | ICD-10-CM

## 2018-04-02 DIAGNOSIS — M25552 Pain in left hip: Principal | ICD-10-CM

## 2018-04-02 NOTE — Telephone Encounter (Signed)
Copied from CRM [id number]. Topic: General - Other >> Apr 02, 2018  9:33 AM Heather Walsh wrote: Reason for CRM: Pt stated she is still the same as last time she saw Dr. Laural Benes. Pt stated she was suppose to get a referral for physical therapy but has not heard from anyone. Cb# [phone]

## 2018-04-02 NOTE — Telephone Encounter (Signed)
Order is in, if she hasn't heard anything by beginning of next week, let us know- we can give her the phone number for the hospital PT too

## 2018-04-02 NOTE — Telephone Encounter (Signed)
Patient notified and number given [facility].

## 2018-11-01 ENCOUNTER — Other Ambulatory Visit: Payer: Self-pay

## 2018-11-01 ENCOUNTER — Ambulatory Visit (INDEPENDENT_AMBULATORY_CARE_PROVIDER_SITE_OTHER): Payer: Medicare HMO

## 2018-11-01 DIAGNOSIS — Z23 Encounter for immunization: Secondary | ICD-10-CM | POA: Diagnosis not present

## 2019-03-10 ENCOUNTER — Other Ambulatory Visit: Payer: Self-pay

## 2019-03-10 ENCOUNTER — Ambulatory Visit (INDEPENDENT_AMBULATORY_CARE_PROVIDER_SITE_OTHER): Payer: Medicare HMO

## 2019-03-10 VITALS — BP 106/78 | HR 78 | Temp 98.1°F | Resp 16 | Ht 62.0 in | Wt 152.5 lb

## 2019-03-10 DIAGNOSIS — Z0000 Encounter for general adult medical examination without abnormal findings: Secondary | ICD-10-CM | POA: Diagnosis not present

## 2019-03-10 NOTE — Progress Notes (Signed)
Subjective:   Heather Walsh is a 69 y.o. female who presents for Medicare Annual (Subsequent) preventive examination.  Review of Systems:   Cardiac Risk Factors include: advanced age (>64men, >58 women);dyslipidemia;hypertension;smoking/ tobacco exposure     Objective:     Vitals: BP 106/78 (BP Location: Right Arm, Patient Position: Sitting, Cuff Size: Normal)   Pulse 78   Temp 98.1 F (36.7 C) (Temporal)   Resp 16   Ht 5\' 2"  (1.575 m)   Wt 152 lb 8 oz (69.2 kg)   BMI 27.89 kg/m   Body mass index is 27.89 kg/m.  Advanced Directives 02/28/2018  Does Patient Have a Medical Advance Directive? Yes  Type of 04/29/2018 of Heather Walsh;Living will  Does patient want to make changes to medical advance directive? No - Patient declined  Copy of Healthcare Power of Attorney in Chart? Yes - validated most recent copy scanned in chart (See row information)    Tobacco Social History   Tobacco Use  Smoking Status Former Smoker  . Packs/day: 1.00  . Years: 20.00  . Pack years: 20.00  . Types: Cigarettes  . Quit date: 06/02/1994  . Years since quitting: 24.7  Smokeless Tobacco Never Used     Counseling given: Not Answered   Clinical Intake:  Pre-visit preparation completed: Yes  Pain : No/denies pain     Nutritional Status: BMI 25 -29 Overweight Nutritional Risks: None Diabetes: No  How often do you need to have someone help you when you read instructions, pamphlets, or other written materials from your doctor or pharmacy?: 1 - Never  Interpreter Needed?: No  Information entered by :: Heather Heisler,LPN  Past Medical History:  Diagnosis Date  . Hyperlipidemia    Past Surgical History:  Procedure Laterality Date  . CESAREAN SECTION     X 2  . COLONOSCOPY  01/01/2015  . DIAPHRAGMATIC HERNIA REPAIR  11/2010  . RIB FRACTURE SURGERY Left 07/2010   repair with fixation   Family History  Problem Relation Age of Onset  . CAD Mother   .  Cancer Mother        lung  . Heart attack Father   . Heart disease Father   . Alcohol abuse Brother   . Cancer Brother        colon  . Cancer Son        bone  . Stroke Maternal Grandfather   . CAD Paternal Grandmother   . CAD Paternal Grandfather   . Cancer Sister        Throat  . Obesity Brother   . Alcohol abuse Son   . Breast cancer Neg Hx    Social History   Socioeconomic History  . Marital status: Divorced    Spouse name: Not on file  . Number of children: 2  . Years of education: Not on file  . Highest education level: Not on file  Occupational History  . Not on file  Tobacco Use  . Smoking status: Former Smoker    Packs/day: 1.00    Years: 20.00    Pack years: 20.00    Types: Cigarettes    Quit date: 06/02/1994    Years since quitting: 24.7  . Smokeless tobacco: Never Used  Substance and Sexual Activity  . Alcohol use: No  . Drug use: Yes    Types: Marijuana  . Sexual activity: Not Currently  Other Topics Concern  . Not on file  Social History Narrative  .  Not on file   Social Determinants of Health   Financial Resource Strain:   . Difficulty of Paying Living Expenses: Not on file  Food Insecurity:   . Worried About Charity fundraiser in the Last Year: Not on file  . Ran Out of Food in the Last Year: Not on file  Transportation Needs:   . Lack of Transportation (Medical): Not on file  . Lack of Transportation (Non-Medical): Not on file  Physical Activity:   . Days of Exercise per Week: Not on file  . Minutes of Exercise per Session: Not on file  Stress:   . Feeling of Stress : Not on file  Social Connections:   . Frequency of Communication with Friends and Family: Not on file  . Frequency of Social Gatherings with Friends and Family: Not on file  . Attends Religious Services: Not on file  . Active Member of Clubs or Organizations: Not on file  . Attends Archivist Meetings: Not on file  . Marital Status: Not on file    Outpatient  Encounter Medications as of 03/10/2019  Medication Sig  . Calcium-Magnesium-Vitamin D (CALCIUM 1200+D3 PO) Take 1 tablet by mouth daily.  . Magnesium 400 MG TABS Take 1 tablet by mouth daily.  . Red Yeast Rice 600 MG CAPS Take 2 capsules by mouth daily.  . [DISCONTINUED] lidocaine (LIDODERM) 5 % Place 1 patch onto the skin daily. Remove & Discard patch within 12 hours or as directed by MD (Patient not taking: Reported on 03/10/2019)   No facility-administered encounter medications on file as of 03/10/2019.    Activities of Daily Living In your present state of health, do you have any difficulty performing the following activities: 03/10/2019  Hearing? N  Comment no hearing aids  Vision? N  Comment eyeglasses, [facility]  Difficulty concentrating or making decisions? N  Walking or climbing stairs? N  Dressing or bathing? N  Doing errands, shopping? N  Preparing Food and eating ? N  Using the Toilet? N  In the past six months, have you accidently leaked urine? N  Do you have problems with loss of bowel control? N  Managing your Medications? N  Managing your Finances? N  Housekeeping or managing your Housekeeping? N  Some recent data might be hidden    Patient Care Team: Heather Roys, DO as PCP - General (Family Medicine)    Assessment:   This is a routine wellness examination for Heather Walsh.  Exercise Activities and Dietary recommendations Current Exercise Habits: The patient does not participate in regular exercise at present, Exercise limited by: None identified  Goals    . Exercise 3x per week (30 min per time)     Recommend starting a routine exercise program at least 3 days a week for 30-45 minutes at a time as tolerated.         Fall Risk: Fall Risk  03/10/2019 02/28/2018 01/02/2017 06/29/2016  Falls in the past year? 0 0 No No  Number falls in past yr: 0 0 - -  Injury with Fall? 0 - - -    FALL RISK PREVENTION PERTAINING TO THE HOME:  Any stairs in or  around the home? No  If so, are there any without handrails? No   Home free of loose throw rugs in walkways, pet beds, electrical cords, etc? Yes  Adequate lighting in your home to reduce risk of falls? Yes   ASSISTIVE DEVICES UTILIZED TO PREVENT FALLS:  Life alert? No  Use of a cane, walker or w/c? No  Grab bars in the bathroom? No  Shower chair or bench in shower? No  Elevated toilet seat or a handicapped toilet? No   DME ORDERS:  DME order needed?  No   TIMED UP AND GO:  Was the test performed? Yes .  Length of time to ambulate 10 feet:  sec.   GAIT:  Appearance of gait: Gait steady and fast 8 with/without the use of an assistive device. Education: Fall risk prevention has been discussed.  Intervention(s) required? No   DME/home health order needed?  No    Depression Screen PHQ 2/9 Scores 03/10/2019 02/28/2018 01/02/2017 06/29/2016  PHQ - 2 Score 0 0 0 0     Cognitive Function     6CIT Screen 02/28/2018 01/02/2017  What Year? - 0 points  What month? 0 points 0 points  What time? 0 points 0 points  Count back from 20 0 points 0 points  Months in reverse 0 points 0 points  Repeat phrase 0 points 4 points  Total Score - 4    Immunization History  Administered Date(s) Administered  . Fluad Quad(high Dose 65+) 11/01/2018  . Influenza, High Dose Seasonal PF 12/10/2017  . Pneumococcal Conjugate-13 01/02/2017  . Pneumococcal Polysaccharide-23 02/28/2018  . Tdap 10/29/2014    Qualifies for Shingles Vaccine? Yes  Zostavax completed n/a. Due for Shingrix. Education has been provided regarding the importance of this vaccine. Pt has been advised to call insurance company to determine out of pocket expense. Advised may also receive vaccine at local pharmacy or Health Dept. Verbalized acceptance and understanding.  Tdap: up to date    Flu Vaccine: up to date   Pneumococcal Vaccine: up to date  Screening Tests Health Maintenance  Topic Date Due  . MAMMOGRAM   02/12/2020  . TETANUS/TDAP  10/28/2024  . COLONOSCOPY  12/31/2024  . INFLUENZA VACCINE  Completed  . DEXA SCAN  Completed  . PNA vac Low Risk Adult  Completed  . Hepatitis C Screening  Addressed    Cancer Screenings:  Colorectal Screening: Completed 01/01/2015. Repeat every 10 years;   Mammogram: Completed 02/11/2018  Bone Density: Completed 02/08/2017  Lung Cancer Screening: (Low Dose CT Chest recommended if Age 9-80 years, 30 pack-year currently smoking OR have quit w/in 15years.) does not qualify.    Additional Screening:  Hepatitis C Screening: does qualify; Completed 10/29/2014  Vision Screening: Recommended annual ophthalmology exams for early detection of glaucoma and other disorders of the eye. Is the patient up to date with their annual eye exam?  Yes  Who is the provider or what is the name of the office in which the pt attends annual eye exams? [facility]    Dental Screening: Recommended annual dental exams for proper oral hygiene  Community Resource Referral:  CRR required this visit?  No       Plan:  I have personally reviewed and addressed the Medicare Annual Wellness questionnaire and have noted the following in the patient's chart:  A. Medical and social history B. Use of alcohol, tobacco or illicit drugs  C. Current medications and supplements D. Functional ability and status E.  Nutritional status F.  Physical activity G. Advance directives H. List of other physicians I.  Hospitalizations, surgeries, and ER visits in previous 12 months J.  Vitals K. Screenings such as hearing and vision if needed, cognitive and depression L. Referrals and appointments   In addition, I  have reviewed and discussed with patient certain preventive protocols, quality metrics, and best practice recommendations. A written personalized care plan for preventive services as well as general preventive health recommendations were provided to patient.  Signed,    Collene Schlichter, LPN  [phone] Nurse Health Advisor   Nurse Notes: none

## 2019-03-10 NOTE — Patient Instructions (Addendum)
Heather Walsh , Thank you for taking time to come for your Medicare Wellness Visit. I appreciate your ongoing commitment to your health goals. Please review the following plan we discussed and let me know if I can assist you in the future.   Screening recommendations/referrals: Colonoscopy: completed 01/01/2015 Mammogram: completed 02/11/2018 Please call [phone] to schedule your mammogram.  Bone Density: completed 02/08/2017 Recommended yearly ophthalmology/optometry visit for glaucoma screening and checkup Recommended yearly dental visit for hygiene and checkup  Vaccinations: Influenza vaccine: up to date  Pneumococcal vaccine: up to date  Tdap vaccine: up to date  Shingles vaccine: shingrix eligible     Advanced directives: Please bring a copy of your health care power of attorney and living will to the office at your convenience.  Conditions/risks identified: please schedule follow up appt with Dr.Johnson.   Next appointment: Follow up in one year for your annual wellness visit    Preventive Care 65 Years and Older, Female Preventive care refers to lifestyle choices and visits with your health care provider that can promote health and wellness. What does preventive care include?  A yearly physical exam. This is also called an annual well check.  Dental exams once or twice a year.  Routine eye exams. Ask your health care provider how often you should have your eyes checked.  Personal lifestyle choices, including:  Daily care of your teeth and gums.  Regular physical activity.  Eating a healthy diet.  Avoiding tobacco and drug use.  Limiting alcohol use.  Practicing safe sex.  Taking low-dose aspirin every day.  Taking vitamin and mineral supplements as recommended by your health care provider. What happens during an annual well check? The services and screenings done by your health care provider during your annual well check will depend on your age, overall  health, lifestyle risk factors, and family history of disease. Counseling  Your health care provider may ask you questions about your:  Alcohol use.  Tobacco use.  Drug use.  Emotional well-being.  Home and relationship well-being.  Sexual activity.  Eating habits.  History of falls.  Memory and ability to understand (cognition).  Work and work Statistician.  Reproductive health. Screening  You may have the following tests or measurements:  Height, weight, and BMI.  Blood pressure.  Lipid and cholesterol levels. These may be checked every 5 years, or more frequently if you are over 31 years old.  Skin check.  Lung cancer screening. You may have this screening every year starting at age 69 if you have a 30-pack-year history of smoking and currently smoke or have quit within the past 15 years.  Fecal occult blood test (FOBT) of the stool. You may have this test every year starting at age 22.  Flexible sigmoidoscopy or colonoscopy. You may have a sigmoidoscopy every 5 years or a colonoscopy every 10 years starting at age 98.  Hepatitis C blood test.  Hepatitis B blood test.  Sexually transmitted disease (STD) testing.  Diabetes screening. This is done by checking your blood sugar (glucose) after you have not eaten for a while (fasting). You may have this done every 1-3 years.  Bone density scan. This is done to screen for osteoporosis. You may have this done starting at age 60.  Mammogram. This may be done every 1-2 years. Talk to your health care provider about how often you should have regular mammograms. Talk with your health care provider about your test results, treatment options, and if necessary, the need  for more tests. Vaccines  Your health care provider may recommend certain vaccines, such as:  Influenza vaccine. This is recommended every year.  Tetanus, diphtheria, and acellular pertussis (Tdap, Td) vaccine. You may need a Td booster every 10  years.  Zoster vaccine. You may need this after age 54.  Pneumococcal 13-valent conjugate (PCV13) vaccine. One dose is recommended after age 3.  Pneumococcal polysaccharide (PPSV23) vaccine. One dose is recommended after age 51. Talk to your health care provider about which screenings and vaccines you need and how often you need them. This information is not intended to replace advice given to you by your health care provider. Make sure you discuss any questions you have with your health care provider. Document Released: 03/05/2015 Document Revised: 10/27/2015 Document Reviewed: 12/08/2014 Elsevier Interactive Patient Education  2017 [employer].

## 2019-03-13 ENCOUNTER — Other Ambulatory Visit: Payer: Self-pay | Admitting: Family Medicine

## 2019-03-13 ENCOUNTER — Ambulatory Visit
Admission: RE | Admit: 2019-03-13 | Discharge: 2019-03-13 | Disposition: A | Discharge status: Home / Self Care | Payer: Medicare HMO | Source: Ambulatory Visit | Attending: Family Medicine | Admitting: Family Medicine

## 2019-03-13 DIAGNOSIS — Z1231 Encounter for screening mammogram for malignant neoplasm of breast: Secondary | ICD-10-CM | POA: Diagnosis not present

## 2019-03-15 ENCOUNTER — Encounter: Payer: Self-pay | Admitting: Family Medicine

## 2019-03-30 IMAGING — DX DG LUMBAR SPINE COMPLETE 4+V
5 series · 5 of 5 positions shown · non-contrast
Comparison: None.

CLINICAL DATA: Low back pain

EXAM:
LUMBAR SPINE - COMPLETE 4+ VIEW

[l-spine ap]
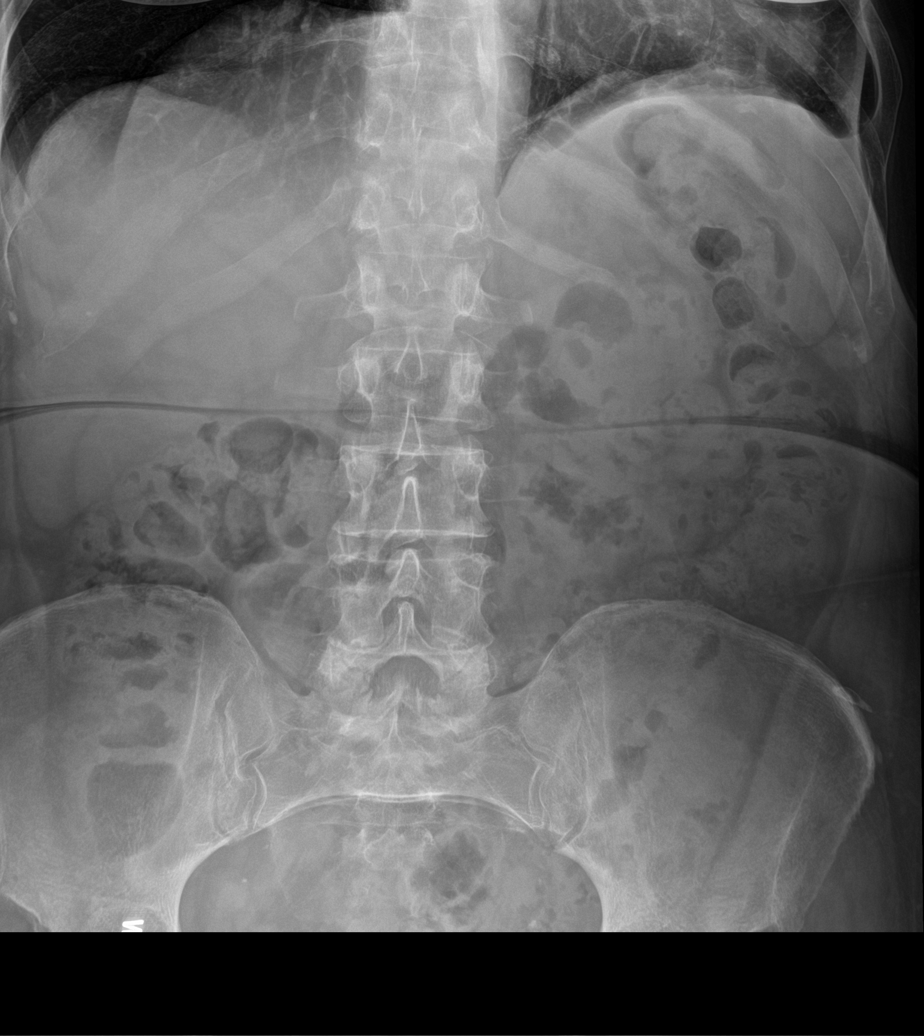

[l-spine obl (1 of 2)]
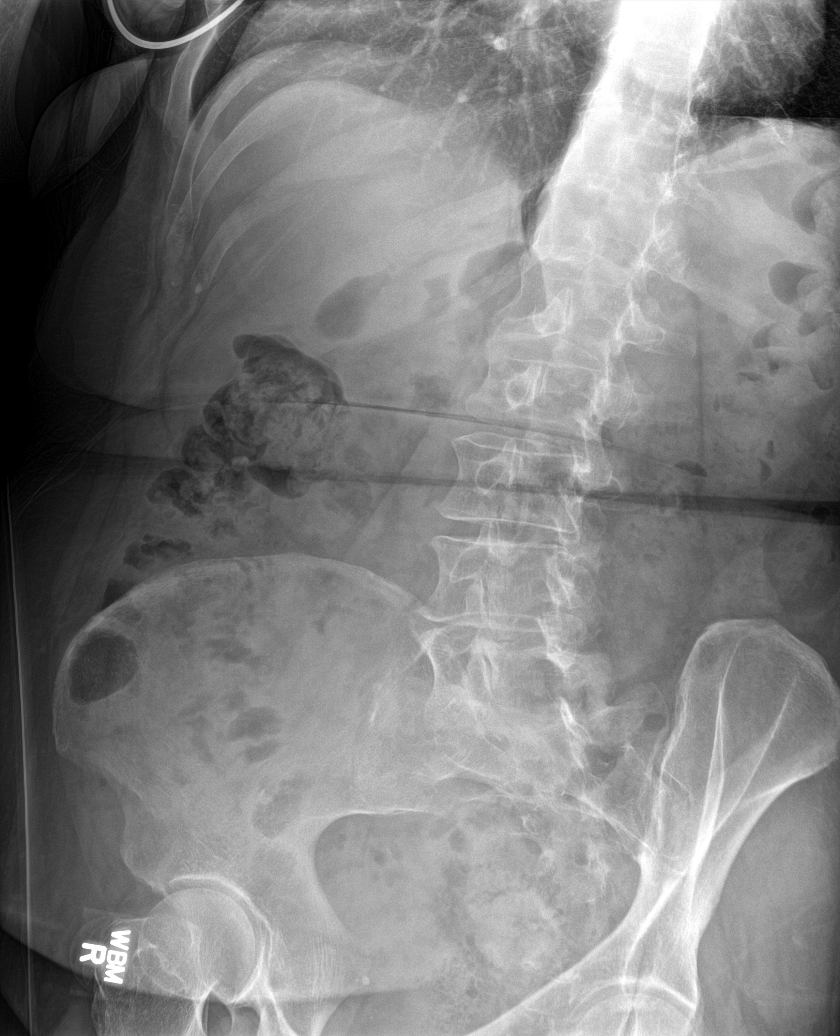

[l-spine obl (2 of 2)]
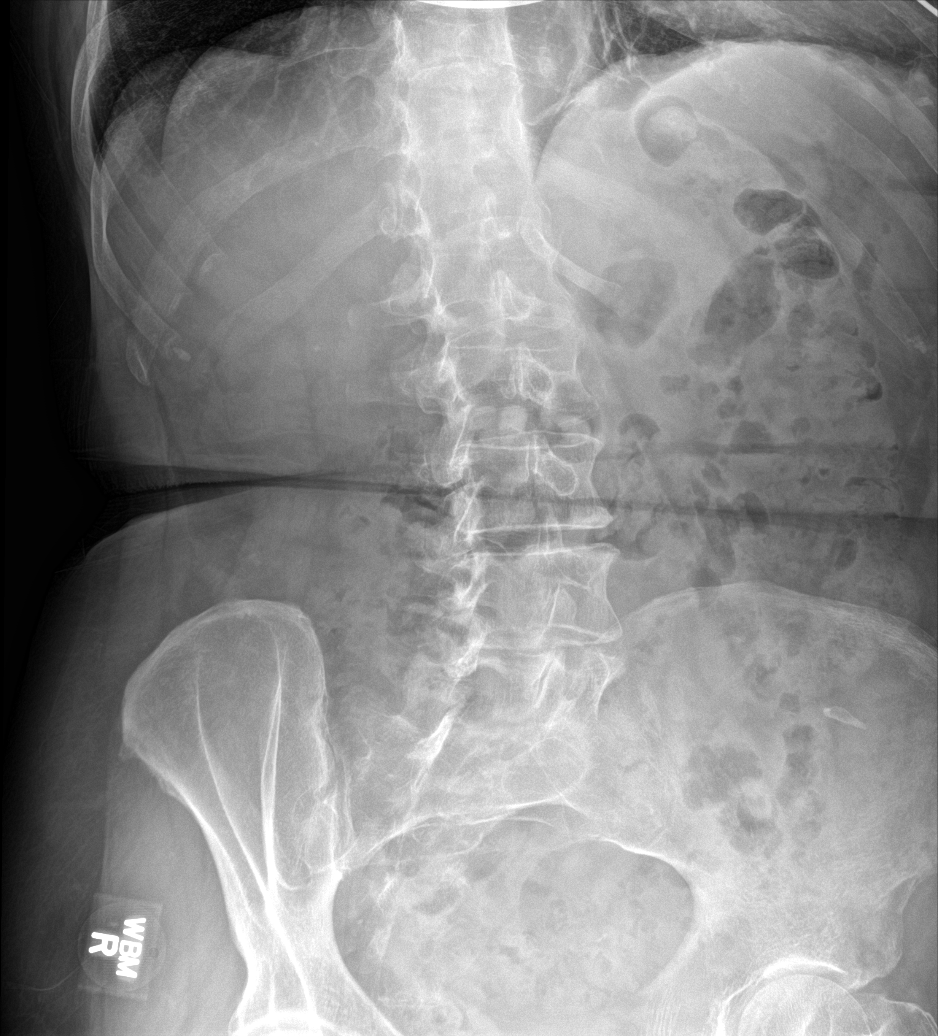

[l-spine lat]
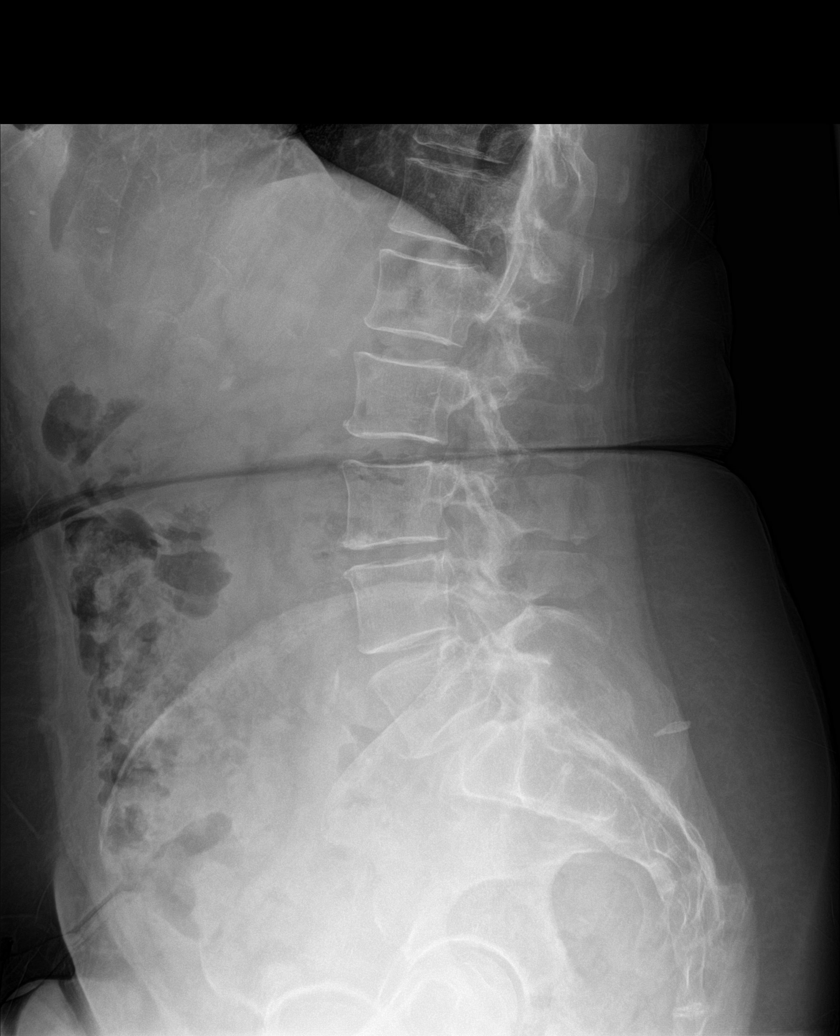

[l-spine spot]
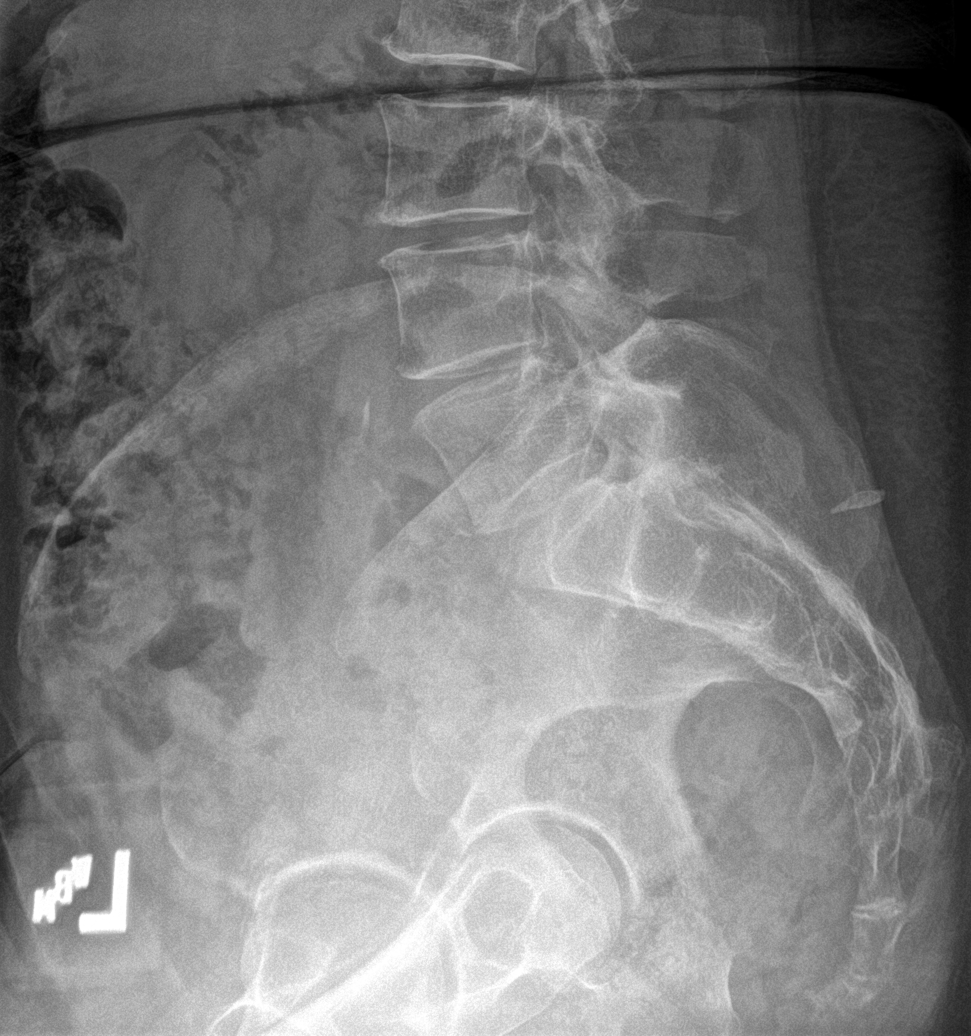

[5 of 5 positions shown; findings below may reference images not displayed]

FINDINGS: Disc space narrowing at L3-4 through L5-S1. Normal alignment. No
fracture. SI joints symmetric and unremarkable.
IMPRESSION: Mild degenerative disc disease in the lower lumbar spine. No acute
bony abnormality.

## 2019-03-30 IMAGING — DX DG HIP (WITH OR WITHOUT PELVIS) 2-3V*L*
2 series · 2 of 2 positions shown · non-contrast
Comparison: None.

CLINICAL DATA: Bilateral hip pain

EXAM:
DG HIP (WITH OR WITHOUT PELVIS) 2-3V LEFT

[hip ap]
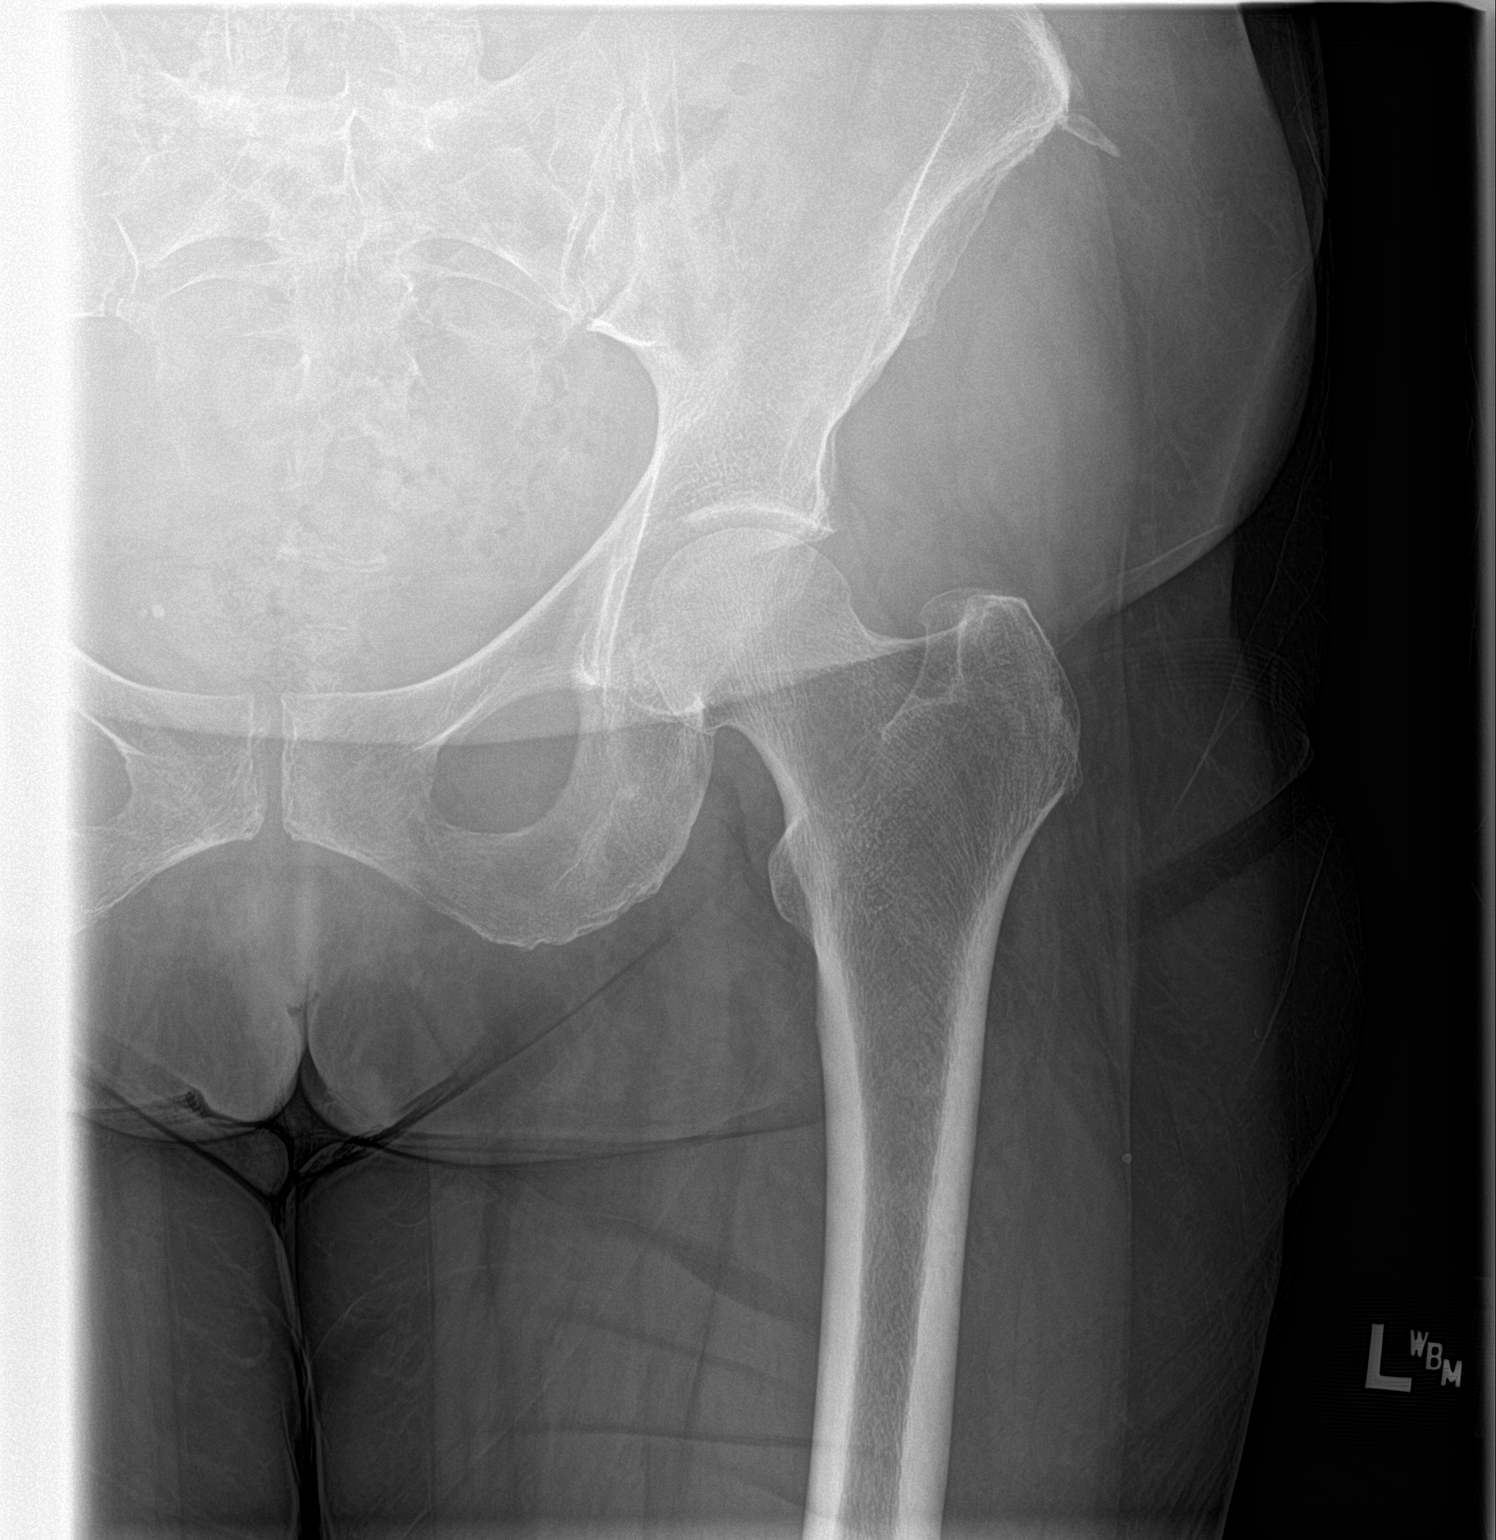

[hip lat]
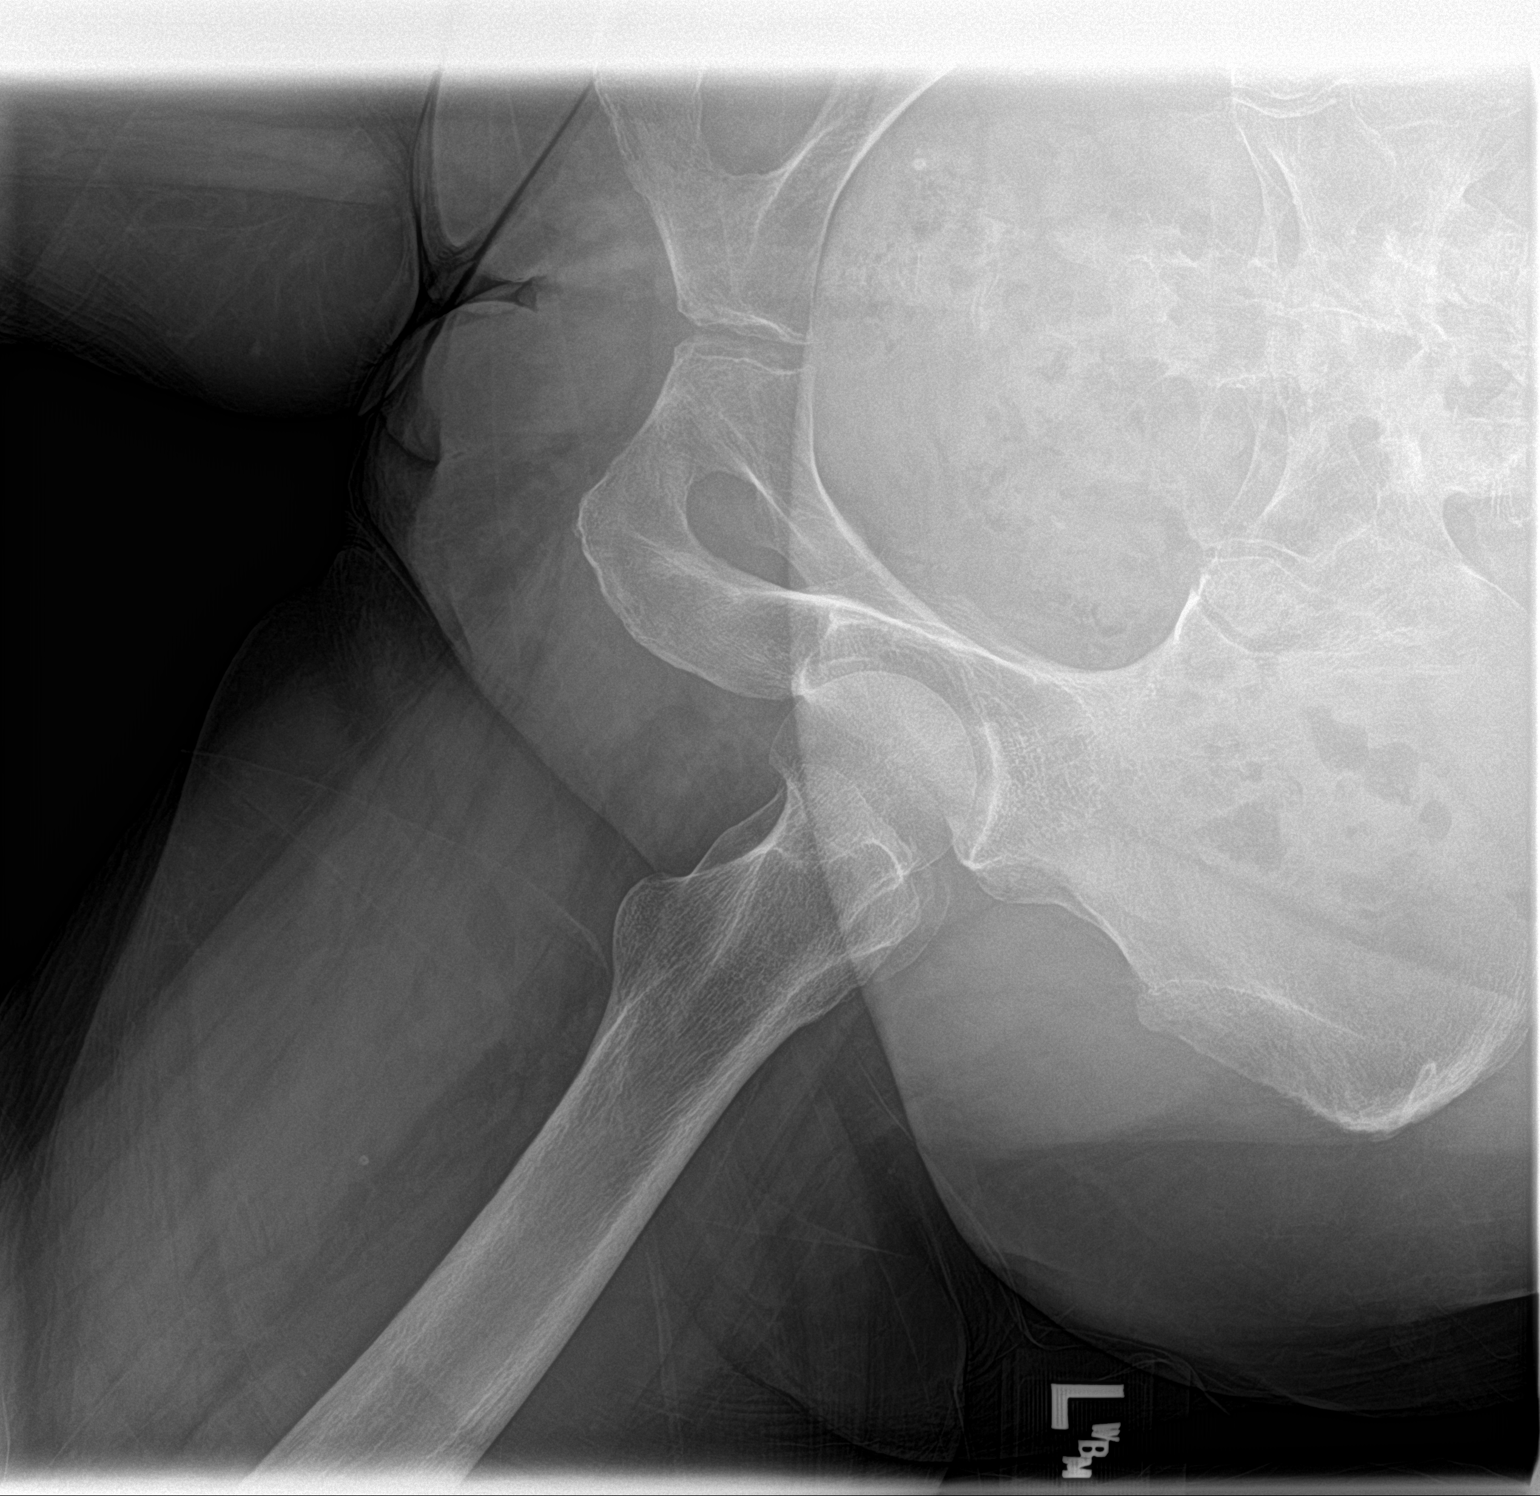

[2 of 2 positions shown; findings below may reference images not displayed]

FINDINGS: Joint spaces maintained. Early spurring. No acute bony abnormality.
Specifically, no fracture, subluxation, or dislocation.
IMPRESSION: No acute bony abnormality.

## 2019-04-07 ENCOUNTER — Ambulatory Visit (INDEPENDENT_AMBULATORY_CARE_PROVIDER_SITE_OTHER): Payer: Medicare HMO | Admitting: Family Medicine

## 2019-04-07 ENCOUNTER — Encounter: Payer: Self-pay | Admitting: Family Medicine

## 2019-04-07 ENCOUNTER — Other Ambulatory Visit: Payer: Self-pay

## 2019-04-07 VITALS — BP 110/80 | HR 70 | Temp 97.2°F

## 2019-04-07 DIAGNOSIS — M810 Age-related osteoporosis without current pathological fracture: Secondary | ICD-10-CM | POA: Diagnosis not present

## 2019-04-07 DIAGNOSIS — Z0000 Encounter for general adult medical examination without abnormal findings: Secondary | ICD-10-CM

## 2019-04-07 DIAGNOSIS — E782 Mixed hyperlipidemia: Secondary | ICD-10-CM | POA: Diagnosis not present

## 2019-04-07 DIAGNOSIS — R8281 Pyuria: Secondary | ICD-10-CM | POA: Diagnosis not present

## 2019-04-07 DIAGNOSIS — Z87891 Personal history of nicotine dependence: Secondary | ICD-10-CM

## 2019-04-07 MED ORDER — ALBUTEROL SULFATE (2.5 MG/3ML) 0.083% IN NEBU: 2.5000 mg | INHALATION_SOLUTION | Freq: Four times a day (QID) | RESPIRATORY_TRACT | 1 refills | Status: DC | PRN

## 2019-04-07 NOTE — Assessment & Plan Note (Signed)
Not interested in medication. Continue red yeast rice. Rechecking labs today. Await results.

## 2019-04-07 NOTE — Progress Notes (Signed)
BP 110/80 (BP Location: Right Arm, Patient Position: Sitting, Cuff Size: Normal)   Pulse 70   Temp (!) 97.2 F (36.2 C) (Oral)   SpO2 96%    Subjective:    Patient ID: Heather Walsh, female    DOB: August 14, 1950, 69 y.o.   MRN: [id number]  HPI: Heather Walsh is a 69 y.o. female presenting on 04/07/2019 for comprehensive medical examination. Current medical complaints include:  HYPERLIPIDEMIA Hyperlipidemia status: stable Satisfied with current treatment?  yes Side effects:  no Medication compliance: excellent compliance Past cholesterol meds: none Supplements: red yeast rice Aspirin:  no The 10-year ASCVD risk score Denman George DC Jr., et al., 2013) is: 6.1%   Values used to calculate the score:     Age: 21 years     Sex: Female     Is Non-Hispanic African American: No     Diabetic: No     Tobacco smoker: No     Systolic Blood Pressure: 110 mmHg     Is BP treated: No     HDL Cholesterol: 81 mg/dL     Total Cholesterol: 307 mg/dL Chest pain:  no Coronary artery disease:  no  Menopausal Symptoms: no  Depression Screen done today and results listed below:  Depression screen [facility] 2/9 03/10/2019 02/28/2018 01/02/2017 06/29/2016  Decreased Interest 0 0 0 0  Down, Depressed, Hopeless 0 0 0 0  PHQ - 2 Score 0 0 0 0    Past Medical History:  Past Medical History:  Diagnosis Date  . Hyperlipidemia     Surgical History:  Past Surgical History:  Procedure Laterality Date  . CESAREAN SECTION     X 2  . COLONOSCOPY  01/01/2015  . DIAPHRAGMATIC HERNIA REPAIR  11/2010  . RIB FRACTURE SURGERY Left 07/2010   repair with fixation    Medications:  Current Outpatient Medications on File Prior to Visit  Medication Sig  . Calcium-Magnesium-Vitamin D (CALCIUM 1200+D3 PO) Take 1 tablet by mouth daily.  . Magnesium 400 MG TABS Take 1 tablet by mouth daily.  . Red Yeast Rice 600 MG CAPS Take 2 capsules by mouth daily.   No current facility-administered medications on file prior to  visit.    Allergies:  Allergies  Allergen Reactions  . Sulfa Antibiotics Swelling    Social History:  Social History   Socioeconomic History  . Marital status: Divorced    Spouse name: Not on file  . Number of children: 2  . Years of education: Not on file  . Highest education level: Not on file  Occupational History  . Not on file  Tobacco Use  . Smoking status: Former Smoker    Packs/day: 1.00    Years: 20.00    Pack years: 20.00    Types: Cigarettes    Quit date: 06/02/1994    Years since quitting: 24.8  . Smokeless tobacco: Never Used  Substance and Sexual Activity  . Alcohol use: No  . Drug use: Yes    Types: Marijuana  . Sexual activity: Not Currently  Other Topics Concern  . Not on file  Social History Narrative  . Not on file   Social Determinants of Health   Financial Resource Strain:   . Difficulty of Paying Living Expenses: Not on file  Food Insecurity:   . Worried About Programme researcher, broadcasting/film/video in the Last Year: Not on file  . Ran Out of Food in the Last Year: Not on file  Transportation Needs:   .  Lack of Transportation (Medical): Not on file  . Lack of Transportation (Non-Medical): Not on file  Physical Activity:   . Days of Exercise per Week: Not on file  . Minutes of Exercise per Session: Not on file  Stress:   . Feeling of Stress : Not on file  Social Connections:   . Frequency of Communication with Friends and Family: Not on file  . Frequency of Social Gatherings with Friends and Family: Not on file  . Attends Religious Services: Not on file  . Active Member of Clubs or Organizations: Not on file  . Attends Banker Meetings: Not on file  . Marital Status: Not on file  Intimate Partner Violence:   . Fear of Current or Ex-Partner: Not on file  . Emotionally Abused: Not on file  . Physically Abused: Not on file  . Sexually Abused: Not on file   Social History   Tobacco Use  Smoking Status Former Smoker  . Packs/day: 1.00  .  Years: 20.00  . Pack years: 20.00  . Types: Cigarettes  . Quit date: 06/02/1994  . Years since quitting: 24.8  Smokeless Tobacco Never Used   Social History   Substance and Sexual Activity  Alcohol Use No    Family History:  Family History  Problem Relation Age of Onset  . CAD Mother   . Cancer Mother        lung  . Heart attack Father   . Heart disease Father   . Alcohol abuse Brother   . Cancer Brother        colon  . Cancer Son        bone  . Stroke Maternal Grandfather   . CAD Paternal Grandmother   . CAD Paternal Grandfather   . Cancer Sister        Throat  . Obesity Brother   . Alcohol abuse Son   . Breast cancer Neg Hx     Past medical history, surgical history, medications, allergies, family history and social history reviewed with patient today and changes made to appropriate areas of the chart.   Review of Systems  Constitutional: Negative.   HENT: Negative.   Eyes: Negative.        Floater a few days ago  Respiratory: Negative.   Cardiovascular: Negative.   Gastrointestinal: Negative.   Genitourinary: Negative.   Musculoskeletal: Positive for back pain and myalgias. Negative for falls, joint pain and neck pain.  Skin: Negative.   Neurological: Negative.   Endo/Heme/Allergies: Negative.   Psychiatric/Behavioral: Negative.     All other ROS negative except what is listed above and in the HPI.      Objective:    BP 110/80 (BP Location: Right Arm, Patient Position: Sitting, Cuff Size: Normal)   Pulse 70   Temp (!) 97.2 F (36.2 C) (Oral)   SpO2 96%   Wt Readings from Last 3 Encounters:  03/10/19 152 lb 8 oz (69.2 kg)  03/18/18 148 lb 6.4 oz (67.3 kg)  02/28/18 147 lb (66.7 kg)    Physical Exam Vitals and nursing note reviewed.  Constitutional:      General: She is not in acute distress.    Appearance: Normal appearance. She is not ill-appearing, toxic-appearing or diaphoretic.  HENT:     Head: Normocephalic and atraumatic.     Right  Ear: Tympanic membrane, ear canal and external ear normal. There is no impacted cerumen.     Left Ear: Tympanic membrane, ear canal  and external ear normal. There is no impacted cerumen.     Nose: Nose normal. No congestion or rhinorrhea.     Mouth/Throat:     Mouth: Mucous membranes are moist.     Pharynx: Oropharynx is clear. No oropharyngeal exudate or posterior oropharyngeal erythema.  Eyes:     General: No scleral icterus.       Right eye: No discharge.        Left eye: No discharge.     Extraocular Movements: Extraocular movements intact.     Conjunctiva/sclera: Conjunctivae normal.     Pupils: Pupils are equal, round, and reactive to light.  Neck:     Vascular: No carotid bruit.  Cardiovascular:     Rate and Rhythm: Normal rate and regular rhythm.     Pulses: Normal pulses.     Heart sounds: No murmur. No friction rub. No gallop.   Pulmonary:     Effort: Pulmonary effort is normal. No respiratory distress.     Breath sounds: No stridor. Wheezing present. No rhonchi or rales.  Chest:     Chest wall: No tenderness.  Abdominal:     General: Abdomen is flat. Bowel sounds are normal. There is no distension.     Palpations: Abdomen is soft. There is no mass.     Tenderness: There is no abdominal tenderness. There is no right CVA tenderness, left CVA tenderness, guarding or rebound.     Hernia: No hernia is present.  Genitourinary:    Comments: Breast and pelvic exams deferred with shared decision making Musculoskeletal:        General: No swelling, tenderness, deformity or signs of injury.     Cervical back: Normal range of motion and neck supple. No rigidity. No muscular tenderness.     Right lower leg: No edema.     Left lower leg: No edema.  Lymphadenopathy:     Cervical: No cervical adenopathy.  Skin:    General: Skin is warm and dry.     Capillary Refill: Capillary refill takes less than 2 seconds.     Coloration: Skin is not jaundiced or pale.     Findings: No  bruising, erythema, lesion or rash.  Neurological:     General: No focal deficit present.     Mental Status: She is alert and oriented to person, place, and time. Mental status is at baseline.     Cranial Nerves: No cranial nerve deficit.     Sensory: No sensory deficit.     Motor: No weakness.     Coordination: Coordination normal.     Gait: Gait normal.     Deep Tendon Reflexes: Reflexes normal.  Psychiatric:        Mood and Affect: Mood normal.        Behavior: Behavior normal.        Thought Content: Thought content normal.        Judgment: Judgment normal.     Results for orders placed or performed in visit on 02/28/18  UA/M w/rflx Culture, Routine   Specimen: Urine   URINE  Result Value Ref Range   Specific Gravity, UA 1.025 1.005 - 1.030   pH, UA 5.5 5.0 - 7.5   Color, UA Yellow Yellow   Appearance Ur Clear Clear   Leukocytes, UA Negative Negative   Protein, UA Negative Negative/Trace   Glucose, UA Negative Negative   Ketones, UA Negative Negative   RBC, UA Negative Negative   Bilirubin, UA Negative Negative  Urobilinogen, Ur 0.2 0.2 - 1.0 mg/dL   Nitrite, UA Negative Negative      Assessment & Plan:   Problem List Items Addressed This Visit      Musculoskeletal and Integument   Osteoporosis    Up to date on DEXA due next year. Will check labs. Continue to monitor. Call with any concerns.       Relevant Orders   CBC with Differential/Platelet   Comprehensive metabolic panel   TSH   VITAMIN D 25 Hydroxy (Vit-D Deficiency, Fractures)     Other   Hyperlipidemia    Not interested in medication. Continue red yeast rice. Rechecking labs today. Await results.       Relevant Orders   CBC with Differential/Platelet   Comprehensive metabolic panel   Lipid Panel w/o Chol/HDL Ratio    Other Visit Diagnoses    Routine general medical examination at a health care facility    -  Primary   Vaccines up to date. Screening labs checked today. Mammogram, DEXA and  colonoscopy up to date. Continue diet and exercise. Call with any concerns.    History of tobacco abuse       Labs drawn today. Await results.    Relevant Orders   UA/M w/rflx Culture, Routine       Follow up plan: Return in about 1 year (around 04/06/2020) for Physical.   LABORATORY TESTING:  - Pap smear: not applicable  IMMUNIZATIONS:   - Tdap: Tetanus vaccination status reviewed: last tetanus booster within 10 years. - Influenza: Up to date - Pneumovax: Up to date - Prevnar: Up to date  SCREENING: -Mammogram: Up to date  - Colonoscopy: Up to date  - Bone Density: Up to date    PATIENT COUNSELING:   Advised to take 1 mg of folate supplement per day if capable of pregnancy.   Sexuality: Discussed sexually transmitted diseases, partner selection, use of condoms, avoidance of unintended pregnancy  and contraceptive alternatives.   Advised to avoid cigarette smoking.  I discussed with the patient that most people either abstain from alcohol or drink within safe limits (<=14/week and <=4 drinks/occasion for males, <=7/weeks and <= 3 drinks/occasion for females) and that the risk for alcohol disorders and other health effects rises proportionally with the number of drinks per week and how often a drinker exceeds daily limits.  Discussed cessation/primary prevention of drug use and availability of treatment for abuse.   Diet: Encouraged to adjust caloric intake to maintain  or achieve ideal body weight, to reduce intake of dietary saturated fat and total fat, to limit sodium intake by avoiding high sodium foods and not adding table salt, and to maintain adequate dietary potassium and calcium preferably from fresh fruits, vegetables, and low-fat dairy products.    stressed the importance of regular exercise  Injury prevention: Discussed safety belts, safety helmets, smoke detector, smoking near bedding or upholstery.   Dental health: Discussed importance of regular tooth  brushing, flossing, and dental visits.    NEXT PREVENTATIVE PHYSICAL DUE IN 1 YEAR. Return in about 1 year (around 04/06/2020) for Physical.

## 2019-04-07 NOTE — Patient Instructions (Signed)
Health Maintenance After Age 68 After age 68, you are at a higher risk for certain long-term diseases and infections as well as injuries from falls. Falls are a major cause of broken bones and head injuries in people who are older than age 68. Getting regular preventive care can help to keep you healthy and well. Preventive care includes getting regular testing and making lifestyle changes as recommended by your health care provider. Talk with your health care provider about:  Which screenings and tests you should have. A screening is a test that checks for a disease when you have no symptoms.  A diet and exercise plan that is right for you. What should I know about screenings and tests to prevent falls? Screening and testing are the best ways to find a health problem early. Early diagnosis and treatment give you the best chance of managing medical conditions that are common after age 68. Certain conditions and lifestyle choices may make you more likely to have a fall. Your health care provider may recommend:  Regular vision checks. Poor vision and conditions such as cataracts can make you more likely to have a fall. If you wear glasses, make sure to get your prescription updated if your vision changes.  Medicine review. Work with your health care provider to regularly review all of the medicines you are taking, including over-the-counter medicines. Ask your health care provider about any side effects that may make you more likely to have a fall. Tell your health care provider if any medicines that you take make you feel dizzy or sleepy.  Osteoporosis screening. Osteoporosis is a condition that causes the bones to get weaker. This can make the bones weak and cause them to break more easily.  Blood pressure screening. Blood pressure changes and medicines to control blood pressure can make you feel dizzy.  Strength and balance checks. Your health care provider may recommend certain tests to check your  strength and balance while standing, walking, or changing positions.  Foot health exam. Foot pain and numbness, as well as not wearing proper footwear, can make you more likely to have a fall.  Depression screening. You may be more likely to have a fall if you have a fear of falling, feel emotionally low, or feel unable to do activities that you used to do.  Alcohol use screening. Using too much alcohol can affect your balance and may make you more likely to have a fall. What actions can I take to lower my risk of falls? General instructions  Talk with your health care provider about your risks for falling. Tell your health care provider if: ? You fall. Be sure to tell your health care provider about all falls, even ones that seem minor. ? You feel dizzy, sleepy, or off-balance.  Take over-the-counter and prescription medicines only as told by your health care provider. These include any supplements.  Eat a healthy diet and maintain a healthy weight. A healthy diet includes low-fat dairy products, low-fat (lean) meats, and fiber from whole grains, beans, and lots of fruits and vegetables. Home safety  Remove any tripping hazards, such as rugs, cords, and clutter.  Install safety equipment such as grab bars in bathrooms and safety rails on stairs.  Keep rooms and walkways well-lit. Activity   Follow a regular exercise program to stay fit. This will help you maintain your balance. Ask your health care provider what types of exercise are appropriate for you.  If you need a cane or   walker, use it as recommended by your health care provider.  Wear supportive shoes that have nonskid soles. Lifestyle  Do not drink alcohol if your health care provider tells you not to drink.  If you drink alcohol, limit how much you have: ? 0-1 drink a day for women. ? 0-2 drinks a day for men.  Be aware of how much alcohol is in your drink. In the [place] one drink equals one typical bottle of beer (12  oz), one-half glass of wine (5 oz), or one shot of hard liquor (1 oz).  Do not use any products that contain nicotine or tobacco, such as cigarettes and e-cigarettes. If you need help quitting, ask your health care provider. Summary  Having a healthy lifestyle and getting preventive care can help to protect your health and wellness after age 68.  Screening and testing are the best way to find a health problem early and help you avoid having a fall. Early diagnosis and treatment give you the best chance for managing medical conditions that are more common for people who are older than age 68.  Falls are a major cause of broken bones and head injuries in people who are older than age 68. Take precautions to prevent a fall at home.  Work with your health care provider to learn what changes you can make to improve your health and wellness and to prevent falls. This information is not intended to replace advice given to you by your health care provider. Make sure you discuss any questions you have with your health care provider. Document Revised: 05/30/2018 Document Reviewed: 12/20/2016 Elsevier Patient Education  2020 [employer]  

## 2019-04-07 NOTE — Assessment & Plan Note (Signed)
Up to date on DEXA due next year. Will check labs. Continue to monitor. Call with any concerns.

## 2019-04-08 LAB — COMPREHENSIVE METABOLIC PANEL
ALT: 9 IU/L (ref 0–32)
AST: 15 IU/L (ref 0–40)
Albumin/Globulin Ratio: 1.7 (ref 1.2–2.2)
Albumin: 4.1 g/dL (ref 3.8–4.8)
Alkaline Phosphatase: 61 IU/L (ref 39–117)
BUN/Creatinine Ratio: 13 (ref 12–28)
BUN: 10 mg/dL (ref 8–27)
Bilirubin Total: <0.2 mg/dL (ref 0.0–1.2)
CO2: 26 mmol/L (ref 20–29)
Calcium: 9.3 mg/dL (ref 8.7–10.3)
Chloride: 103 mmol/L (ref 96–106)
Creatinine, Ser: 0.8 mg/dL (ref 0.57–1.00)
GFR calc Af Amer: 88 mL/min/{1.73_m2} (ref >59)
GFR calc non Af Amer: 76 mL/min/{1.73_m2} (ref >59)
Globulin, Total: 2.4 g/dL (ref 1.5–4.5)
Glucose: 81 mg/dL (ref 65–99)
Potassium: 4.2 mmol/L (ref 3.5–5.2)
Sodium: 143 mmol/L (ref 134–144)
Total Protein: 6.5 g/dL (ref 6.0–8.5)

## 2019-04-08 LAB — CBC WITH DIFFERENTIAL/PLATELET
Basophils Absolute: 0.1 10*3/uL (ref 0.0–0.2)
Basos: 1 %
EOS (ABSOLUTE): 0.1 10*3/uL (ref 0.0–0.4)
Eos: 1 %
Hematocrit: 42.3 % (ref 34.0–46.6)
Hemoglobin: 14 g/dL (ref 11.1–15.9)
Immature Grans (Abs): 0 10*3/uL (ref 0.0–0.1)
Immature Granulocytes: 0 %
Lymphocytes Absolute: 1.8 10*3/uL (ref 0.7–3.1)
Lymphs: 26 %
MCH: 29.9 pg (ref 26.6–33.0)
MCHC: 33.1 g/dL (ref 31.5–35.7)
MCV: 90 fL (ref 79–97)
Monocytes Absolute: 0.5 10*3/uL (ref 0.1–0.9)
Monocytes: 8 %
Neutrophils Absolute: 4.6 10*3/uL (ref 1.4–7.0)
Neutrophils: 64 %
Platelets: 179 10*3/uL (ref 150–450)
RBC: 4.69 x10E6/uL (ref 3.77–5.28)
RDW: 13.2 % (ref 11.7–15.4)
WBC: 7.1 10*3/uL (ref 3.4–10.8)

## 2019-04-08 LAB — LIPID PANEL W/O CHOL/HDL RATIO
Cholesterol, Total: 270 mg/dL — ABNORMAL HIGH (ref 100–199)
HDL: 65 mg/dL (ref >39)
LDL Chol Calc (NIH): 184 mg/dL — ABNORMAL HIGH (ref 0–99)
Triglycerides: 119 mg/dL (ref 0–149)
VLDL Cholesterol Cal: 21 mg/dL (ref 5–40)

## 2019-04-08 LAB — TSH: TSH: 2.18 u[IU]/mL (ref 0.450–4.500)

## 2019-04-08 LAB — VITAMIN D 25 HYDROXY (VIT D DEFICIENCY, FRACTURES): Vit D, 25-Hydroxy: 23.8 ng/mL — ABNORMAL LOW (ref 30.0–100.0)

## 2019-04-09 LAB — MICROSCOPIC EXAMINATION

## 2019-04-09 LAB — UA/M W/RFLX CULTURE, ROUTINE
Bilirubin, UA: NEGATIVE
Glucose, UA: NEGATIVE
Ketones, UA: NEGATIVE
Nitrite, UA: NEGATIVE
Protein,UA: NEGATIVE
Specific Gravity, UA: 1.02 (ref 1.005–1.030)
Urobilinogen, Ur: 0.2 mg/dL (ref 0.2–1.0)
pH, UA: 7 (ref 5.0–7.5)

## 2019-04-09 LAB — URINE CULTURE, REFLEX

## 2019-04-11 ENCOUNTER — Encounter: Payer: Self-pay | Admitting: Family Medicine

## 2019-04-18 ENCOUNTER — Telehealth: Payer: Self-pay | Admitting: Family Medicine

## 2019-04-18 NOTE — Telephone Encounter (Signed)
If not wheezing any more OK to stop albuterol. Let us know if swelling doesn't go down

## 2019-04-18 NOTE — Telephone Encounter (Signed)
Patient notified and verbalized understanding. 

## 2019-04-18 NOTE — Telephone Encounter (Signed)
Copied from CRM [id number]. Topic: General - Other >> Apr 18, 2019  2:09 PM Heather Walsh A wrote: Patient called tried to vials of albuterol and her legs became swollen.  Patient would like to know what Dr. Laural Benes would advise for the medication she was prescribed. Please  advise. Patient not wheezing anymore like on last visit.

## 2019-07-08 DIAGNOSIS — H524 Presbyopia: Secondary | ICD-10-CM | POA: Diagnosis not present

## 2019-12-25 ENCOUNTER — Other Ambulatory Visit: Payer: Self-pay

## 2019-12-25 ENCOUNTER — Encounter: Payer: Self-pay | Admitting: Family Medicine

## 2019-12-25 ENCOUNTER — Ambulatory Visit (INDEPENDENT_AMBULATORY_CARE_PROVIDER_SITE_OTHER): Payer: Medicare HMO | Admitting: Family Medicine

## 2019-12-25 VITALS — BP 136/86 | HR 69 | Temp 98.4°F | Ht 62.0 in | Wt 141.0 lb

## 2019-12-25 DIAGNOSIS — L821 Other seborrheic keratosis: Secondary | ICD-10-CM

## 2019-12-25 DIAGNOSIS — R42 Dizziness and giddiness: Secondary | ICD-10-CM | POA: Diagnosis not present

## 2019-12-25 DIAGNOSIS — Z23 Encounter for immunization: Secondary | ICD-10-CM | POA: Diagnosis not present

## 2019-12-25 DIAGNOSIS — E782 Mixed hyperlipidemia: Secondary | ICD-10-CM

## 2019-12-25 NOTE — Progress Notes (Signed)
BP 136/86   Pulse 69   Temp 98.4 F (36.9 C) (Oral)   Ht 5\' 2"  (1.575 m)   Wt 141 lb (64 kg)   SpO2 95%   BMI 25.79 kg/m    Subjective:    Patient ID: Heather Walsh, female    DOB: [birth year], 69 y.o.   MRN: [id number]  HPI: Heather Walsh is a 69 y.o. female  Chief Complaint  Patient presents with  . Nevus    X1 month, brown moles on left side, not painful   . Flu Vaccine   SKIN LESION Duration: about a month Location: L side Painful: no Itching: no Onset: sudden Context: not changing Associated signs and symptoms: none History of skin cancer: no   HYPERLIPIDEMIA Hyperlipidemia status: stable Satisfied with current treatment?  yes Side effects:  not on anything Medication compliance:  Supplements: red yeast rice Aspirin:  no The 10-year ASCVD risk score 73 DC Jr., et al., 2013) is: 9.2%   Values used to calculate the score:     Age: 4 years     Sex: Female     Is Non-Hispanic African American: No     Diabetic: No     Tobacco smoker: No     Systolic Blood Pressure: 136 mmHg     Is BP treated: No     HDL Cholesterol: 65 mg/dL     Total Cholesterol: 270 mg/dL Chest pain:  no Coronary artery disease:  no  Had an episode of dizziness while working in the heat over the summer. It hasn't happened again since. She is otherwise doing well.   Relevant past medical, surgical, family and social history reviewed and updated as indicated. Interim medical history since our last visit reviewed. Allergies and medications reviewed and updated.  Review of Systems  Constitutional: Negative.   Respiratory: Negative.   Cardiovascular: Negative.   Musculoskeletal: Negative.   Neurological: Positive for dizziness. Negative for tremors, seizures, syncope, facial asymmetry, speech difficulty, weakness, light-headedness, numbness and headaches.  Psychiatric/Behavioral: Negative.     Per HPI unless specifically indicated above     Objective:    BP 136/86   Pulse  69   Temp 98.4 F (36.9 C) (Oral)   Ht 5\' 2"  (1.575 m)   Wt 141 lb (64 kg)   SpO2 95%   BMI 25.79 kg/m   Wt Readings from Last 3 Encounters:  12/25/19 141 lb (64 kg)  03/10/19 152 lb 8 oz (69.2 kg)  03/18/18 148 lb 6.4 oz (67.3 kg)    Physical Exam Vitals and nursing note reviewed.  Constitutional:      General: She is not in acute distress.    Appearance: Normal appearance. She is not ill-appearing, toxic-appearing or diaphoretic.  HENT:     Head: Normocephalic and atraumatic.     Right Ear: External ear normal.     Left Ear: External ear normal.     Nose: Nose normal.     Mouth/Throat:     Mouth: Mucous membranes are moist.     Pharynx: Oropharynx is clear.  Eyes:     General: No scleral icterus.       Right eye: No discharge.        Left eye: No discharge.     Extraocular Movements: Extraocular movements intact.     Conjunctiva/sclera: Conjunctivae normal.     Pupils: Pupils are equal, round, and reactive to light.  Cardiovascular:     Rate and Rhythm: Normal rate and  regular rhythm.     Pulses: Normal pulses.     Heart sounds: Normal heart sounds. No murmur heard.  No friction rub. No gallop.   Pulmonary:     Effort: Pulmonary effort is normal. No respiratory distress.     Breath sounds: Normal breath sounds. No stridor. No wheezing, rhonchi or rales.  Chest:     Chest wall: No tenderness.  Musculoskeletal:        General: Normal range of motion.     Cervical back: Normal range of motion and neck supple.  Skin:    General: Skin is warm and dry.     Capillary Refill: Capillary refill takes less than 2 seconds.     Coloration: Skin is not jaundiced or pale.     Findings: No bruising, erythema, lesion or rash.  Neurological:     General: No focal deficit present.     Mental Status: She is alert and oriented to person, place, and time. Mental status is at baseline.  Psychiatric:        Mood and Affect: Mood normal.        Behavior: Behavior normal.         Thought Content: Thought content normal.        Judgment: Judgment normal.     Results for orders placed or performed in visit on 04/07/19  Microscopic Examination   URINE  Result Value Ref Range   WBC, UA 6-10 (A) 0 - 5 /hpf   RBC 3-10 (A) 0 - 2 /hpf   Epithelial Cells (non renal) 0-10 0 - 10 /hpf   Mucus, UA Present Not Estab.   Bacteria, UA Few (A) None seen/Few  Urine Culture, Reflex   URINE  Result Value Ref Range   Urine Culture, Routine Final report    Organism ID, Bacteria Comment   CBC with Differential/Platelet  Result Value Ref Range   WBC 7.1 3.4 - 10.8 x10E3/uL   RBC 4.69 3.77 - 5.28 x10E6/uL   Hemoglobin 14.0 11.1 - 15.9 g/dL   Hematocrit 97.6 73.4 - 46.6 %   MCV 90 79 - 97 fL   MCH 29.9 26.6 - 33.0 pg   MCHC 33.1 31 - 35 g/dL   RDW 19.3 79.0 - 24.0 %   Platelets 179 150 - 450 x10E3/uL   Neutrophils 64 Not Estab. %   Lymphs 26 Not Estab. %   Monocytes 8 Not Estab. %   Eos 1 Not Estab. %   Basos 1 Not Estab. %   Neutrophils Absolute 4.6 1.40 - 7.00 x10E3/uL   Lymphocytes Absolute 1.8 0 - 3 x10E3/uL   Monocytes Absolute 0.5 0 - 0 x10E3/uL   EOS (ABSOLUTE) 0.1 0.0 - 0.4 x10E3/uL   Basophils Absolute 0.1 0 - 0 x10E3/uL   Immature Granulocytes 0 Not Estab. %   Immature Grans (Abs) 0.0 0.0 - 0.1 x10E3/uL  Comprehensive metabolic panel  Result Value Ref Range   Glucose 81 65 - 99 mg/dL   BUN 10 8 - 27 mg/dL   Creatinine, Ser 9.73 0.57 - 1.00 mg/dL   GFR calc non Af Amer 76 >59 mL/min/1.73   GFR calc Af Amer 88 >59 mL/min/1.73   BUN/Creatinine Ratio 13 12 - 28   Sodium 143 134 - 144 mmol/L   Potassium 4.2 3.5 - 5.2 mmol/L   Chloride 103 96 - 106 mmol/L   CO2 26 20 - 29 mmol/L   Calcium 9.3 8.7 - 10.3 mg/dL   Total  Protein 6.5 6.0 - 8.5 g/dL   Albumin 4.1 3.8 - 4.8 g/dL   Globulin, Total 2.4 1.5 - 4.5 g/dL   Albumin/Globulin Ratio 1.7 1.2 - 2.2   Bilirubin Total <0.2 0.0 - 1.2 mg/dL   Alkaline Phosphatase 61 39 - 117 IU/L   AST 15 0 - 40 IU/L   ALT 9  0 - 32 IU/L  Lipid Panel w/o Chol/HDL Ratio  Result Value Ref Range   Cholesterol, Total 270 (H) 100 - 199 mg/dL   Triglycerides 725 0 - 149 mg/dL   HDL 65 >36 mg/dL   VLDL Cholesterol Cal 21 5 - 40 mg/dL   LDL Chol Calc (NIH) 644 (H) 0 - 99 mg/dL  TSH  Result Value Ref Range   TSH 2.180 0.450 - 4.500 uIU/mL  UA/M w/rflx Culture, Routine   Specimen: Urine   URINE  Result Value Ref Range   Specific Gravity, UA 1.020 1.005 - 1.030   pH, UA 7.0 5.0 - 7.5   Color, UA Yellow Yellow   Appearance Ur Clear Clear   Leukocytes,UA 1+ (A) Negative   Protein,UA Negative Negative/Trace   Glucose, UA Negative Negative   Ketones, UA Negative Negative   RBC, UA Trace (A) Negative   Bilirubin, UA Negative Negative   Urobilinogen, Ur 0.2 0.2 - 1.0 mg/dL   Nitrite, UA Negative Negative   Microscopic Examination See below:    Urinalysis Reflex Comment   VITAMIN D 25 Hydroxy (Vit-D Deficiency, Fractures)  Result Value Ref Range   Vit D, 25-Hydroxy 23.8 (L) 30.0 - 100.0 ng/mL      Assessment & Plan:   Problem List Items Addressed This Visit      Other   Hyperlipidemia    Rechecking labs today. Await results. Treat as needed.       Relevant Orders   Lipid Panel w/o Chol/HDL Ratio    Other Visit Diagnoses    Seborrheic keratosis    -  Primary   Reassured patient. Continue to monitor. Call with any concerns.    Dizziness       Likely due to dehydration. Hasn't happened again. Checking labs today. Await results.    Relevant Orders   Comprehensive metabolic panel   CBC with Differential/Platelet   Need for immunization against influenza       Flu shot given today.   Relevant Orders   Flu Vaccine QUAD High Dose(Fluad) (Completed)       Follow up plan: Return Feb- Physical.

## 2019-12-25 NOTE — Assessment & Plan Note (Signed)
Rechecking labs today. Await results. Treat as needed.  °

## 2019-12-26 ENCOUNTER — Encounter: Payer: Self-pay | Admitting: Family Medicine

## 2019-12-26 LAB — COMPREHENSIVE METABOLIC PANEL
ALT: 17 IU/L (ref 0–32)
AST: 25 IU/L (ref 0–40)
Albumin/Globulin Ratio: 2.5 — ABNORMAL HIGH (ref 1.2–2.2)
Albumin: 4.7 g/dL (ref 3.8–4.8)
Alkaline Phosphatase: 54 IU/L (ref 44–121)
BUN/Creatinine Ratio: 18 (ref 12–28)
BUN: 13 mg/dL (ref 8–27)
Bilirubin Total: 0.3 mg/dL (ref 0.0–1.2)
CO2: 27 mmol/L (ref 20–29)
Calcium: 9.4 mg/dL (ref 8.7–10.3)
Chloride: 103 mmol/L (ref 96–106)
Creatinine, Ser: 0.74 mg/dL (ref 0.57–1.00)
GFR calc Af Amer: 96 mL/min/{1.73_m2} (ref >59)
GFR calc non Af Amer: 84 mL/min/{1.73_m2} (ref >59)
Globulin, Total: 1.9 g/dL (ref 1.5–4.5)
Glucose: 84 mg/dL (ref 65–99)
Potassium: 4.5 mmol/L (ref 3.5–5.2)
Sodium: 141 mmol/L (ref 134–144)
Total Protein: 6.6 g/dL (ref 6.0–8.5)

## 2019-12-26 LAB — CBC WITH DIFFERENTIAL/PLATELET
Basophils Absolute: 0 10*3/uL (ref 0.0–0.2)
Basos: 1 %
EOS (ABSOLUTE): 0.1 10*3/uL (ref 0.0–0.4)
Eos: 1 %
Hematocrit: 40.4 % (ref 34.0–46.6)
Hemoglobin: 13.3 g/dL (ref 11.1–15.9)
Immature Grans (Abs): 0 10*3/uL (ref 0.0–0.1)
Immature Granulocytes: 0 %
Lymphocytes Absolute: 1.3 10*3/uL (ref 0.7–3.1)
Lymphs: 28 %
MCH: 31 pg (ref 26.6–33.0)
MCHC: 32.9 g/dL (ref 31.5–35.7)
MCV: 94 fL (ref 79–97)
Monocytes Absolute: 0.4 10*3/uL (ref 0.1–0.9)
Monocytes: 9 %
Neutrophils Absolute: 2.9 10*3/uL (ref 1.4–7.0)
Neutrophils: 61 %
Platelets: 183 10*3/uL (ref 150–450)
RBC: 4.29 x10E6/uL (ref 3.77–5.28)
RDW: 13.5 % (ref 11.7–15.4)
WBC: 4.7 10*3/uL (ref 3.4–10.8)

## 2019-12-26 LAB — LIPID PANEL W/O CHOL/HDL RATIO
Cholesterol, Total: 268 mg/dL — ABNORMAL HIGH (ref 100–199)
HDL: 82 mg/dL (ref >39)
LDL Chol Calc (NIH): 171 mg/dL — ABNORMAL HIGH (ref 0–99)
Triglycerides: 91 mg/dL (ref 0–149)
VLDL Cholesterol Cal: 15 mg/dL (ref 5–40)

## 2020-02-05 DIAGNOSIS — Z20822 Contact with and (suspected) exposure to covid-19: Secondary | ICD-10-CM | POA: Diagnosis not present

## 2020-03-09 ENCOUNTER — Telehealth: Payer: Self-pay | Admitting: Family Medicine

## 2020-03-09 NOTE — Telephone Encounter (Signed)
Copied from CRM [id number]. Topic: Medicare AWV >> Mar 09, 2020  2:12 PM Geraldine Contras wrote: Reason for CRM:   Left message for patient reference Mar 18, 2020 AWVS appointment cancelled and rescheduled to be completed by phone not in office on Jan 21,2022 at 1:00 pm. -srs

## 2020-03-12 ENCOUNTER — Telehealth: Payer: Self-pay

## 2020-03-12 ENCOUNTER — Ambulatory Visit: Payer: Medicare HMO

## 2020-03-12 NOTE — Telephone Encounter (Signed)
This nurse called patient 3 times in order to perform scheduled telephonic AWV. Called at 1255, 1300, and 1310. Message left for patient to call and reschedule for another time.

## 2020-03-12 NOTE — Telephone Encounter (Signed)
Copied from CRM [id number]. Topic: General - Other >> Mar 12, 2020  3:29 PM Herby Abraham C wrote: Reason for CRM: pt called in for assistance. Pt says that she had a wellness visit scheduled for today and somehow missed the phone call. Pt would like a call back to reschedule.   CB: [phone]

## 2020-03-15 ENCOUNTER — Ambulatory Visit: Payer: Medicare HMO

## 2020-03-16 ENCOUNTER — Telehealth: Payer: Self-pay | Admitting: Family Medicine

## 2020-03-16 NOTE — Telephone Encounter (Signed)
Copied from CRM [id number]. Topic: Medicare AWV >> Mar 16, 2020 11:39 AM Claudette Laws R wrote: Reason for CRM:  Left message for patient to call back and schedule the Medicare Annual Wellness Visit (AWV) virtually or by telephone.  Last AWV 03/10/2019  Please schedule at anytime with [facility]Nurse Health Advisor.  45 minute appointment  Any questions, please call me at [phone]

## 2020-03-18 ENCOUNTER — Ambulatory Visit: Payer: Medicare HMO

## 2020-04-09 ENCOUNTER — Encounter: Payer: Medicare HMO | Admitting: Family Medicine

## 2020-05-05 ENCOUNTER — Other Ambulatory Visit: Payer: Self-pay | Admitting: Family Medicine

## 2020-05-05 DIAGNOSIS — Z1231 Encounter for screening mammogram for malignant neoplasm of breast: Secondary | ICD-10-CM

## 2020-05-07 ENCOUNTER — Ambulatory Visit (INDEPENDENT_AMBULATORY_CARE_PROVIDER_SITE_OTHER): Payer: Medicare HMO

## 2020-05-07 VITALS — Ht 62.0 in | Wt 138.0 lb

## 2020-05-07 DIAGNOSIS — Z0000 Encounter for general adult medical examination without abnormal findings: Secondary | ICD-10-CM

## 2020-05-07 NOTE — Progress Notes (Signed)
I connected with Heather Walsh today by telephone and verified that I am speaking with the correct person using two identifiers. Location patient: home Location provider: work Persons participating in the virtual visit: Nanci, [place] LPN.   I discussed the limitations, risks, security and privacy concerns of performing an evaluation and management service by telephone and the availability of in person appointments. I also discussed with the patient that there may be a patient responsible charge related to this service. The patient expressed understanding and verbally consented to this telephonic visit.    Interactive audio and video telecommunications were attempted between this provider and patient, however failed, due to patient having technical difficulties OR patient did not have access to video capability.  We continued and completed visit with audio only.     Vital signs may be patient reported or missing.  Subjective:   Heather Walsh is a 70 y.o. female who presents for Medicare Annual (Subsequent) preventive examination.  Review of Systems     Cardiac Risk Factors include: advanced age (>19men, >21 women);dyslipidemia;sedentary lifestyle     Objective:    Today's Vitals   05/07/20 1426  Weight: 138 lb (62.6 kg)  Height: 5\' 2"  (1.575 m)   Body mass index is 25.24 kg/m.  Advanced Directives 05/07/2020 02/28/2018  Does Patient Have a Medical Advance Directive? Yes Yes  Type of 04/29/2018 of [place];Living will Healthcare Power of Institute;Living will  Does patient want to make changes to medical advance directive? - No - Patient declined  Copy of Healthcare Power of Attorney in Chart? Yes - validated most recent copy scanned in chart (See row information) Yes - validated most recent copy scanned in chart (See row information)    Current Medications (verified) Outpatient Encounter Medications as of 05/07/2020  Medication Sig  .  Calcium-Magnesium-Vitamin D (CALCIUM 1200+D3 PO) Take 1 tablet by mouth daily.  . Magnesium 400 MG TABS Take 1 tablet by mouth daily.  . Red Yeast Rice 600 MG CAPS Take 2 capsules by mouth daily.  05/09/2020 albuterol (PROVENTIL) (2.5 MG/3ML) 0.083% nebulizer solution Take 3 mLs (2.5 mg total) by nebulization every 6 (six) hours as needed for wheezing or shortness of breath. (Patient not taking: No sig reported)   No facility-administered encounter medications on file as of 05/07/2020.    Allergies (verified) Sulfa antibiotics   History: Past Medical History:  Diagnosis Date  . Hyperlipidemia    Past Surgical History:  Procedure Laterality Date  . CESAREAN SECTION     X 2  . COLONOSCOPY  01/01/2015  . DIAPHRAGMATIC HERNIA REPAIR  11/2010  . RIB FRACTURE SURGERY Left 07/2010   repair with fixation   Family History  Problem Relation Age of Onset  . CAD Mother   . Cancer Mother        lung  . Heart attack Father   . Heart disease Father   . Alcohol abuse Brother   . Cancer Brother        colon  . Cancer Son        bone  . Stroke Maternal Grandfather   . CAD Paternal Grandmother   . CAD Paternal Grandfather   . Cancer Sister        Throat  . Obesity Brother   . Alcohol abuse Son   . Breast cancer Neg Hx    Social History   Socioeconomic History  . Marital status: Divorced    Spouse name: Not on file  .  Number of children: 2  . Years of education: Not on file  . Highest education level: Not on file  Occupational History  . Not on file  Tobacco Use  . Smoking status: Former Smoker    Packs/day: 1.00    Years: 20.00    Pack years: 20.00    Types: Cigarettes    Quit date: 06/02/1994    Years since quitting: 25.9  . Smokeless tobacco: Never Used  Vaping Use  . Vaping Use: Never used  Substance and Sexual Activity  . Alcohol use: No  . Drug use: Yes    Types: Marijuana  . Sexual activity: Not Currently  Other Topics Concern  . Not on file  Social History Narrative   . Not on file   Social Determinants of Health   Financial Resource Strain: Low Risk   . Difficulty of Paying Living Expenses: Not hard at all  Food Insecurity: No Food Insecurity  . Worried About Programme researcher, broadcasting/film/video in the Last Year: Never true  . Ran Out of Food in the Last Year: Never true  Transportation Needs: No Transportation Needs  . Lack of Transportation (Medical): No  . Lack of Transportation (Non-Medical): No  Physical Activity: Inactive  . Days of Exercise per Week: 0 days  . Minutes of Exercise per Session: 0 min  Stress: No Stress Concern Present  . Feeling of Stress : Not at all  Social Connections: Not on file    Tobacco Counseling Counseling given: Not Answered   Clinical Intake:  Pre-visit preparation completed: Yes        Nutritional Status: BMI 25 -29 Overweight Nutritional Risks: None Diabetes: No  How often do you need to have someone help you when you read instructions, pamphlets, or other written materials from your doctor or pharmacy?: 1 - Never What is the last grade level you completed in school?: 12th grade  Diabetic? no  Interpreter Needed?: No  Information entered by :: NAllen LPN   Activities of Daily Living In your present state of health, do you have any difficulty performing the following activities: 05/07/2020  Hearing? N  Vision? N  Difficulty concentrating or making decisions? N  Walking or climbing stairs? N  Dressing or bathing? N  Doing errands, shopping? N  Preparing Food and eating ? N  Using the Toilet? N  In the past six months, have you accidently leaked urine? N  Do you have problems with loss of bowel control? N  Managing your Medications? N  Managing your Finances? N  Housekeeping or managing your Housekeeping? N  Some recent data might be hidden    Patient Care Team: Dorcas Carrow, DO as PCP - General (Family Medicine)  Indicate any recent Medical Services you may have received from other than [facility]  providers in the past year (date may be approximate).     Assessment:   This is a routine wellness examination for Story.  Hearing/Vision screen No exam data present  Dietary issues and exercise activities discussed: Current Exercise Habits: The patient does not participate in regular exercise at present  Goals    . Exercise 3x per week (30 min per time)     Recommend starting a routine exercise program at least 3 days a week for 30-45 minutes at a time as tolerated.      . Patient Stated     05/07/2020, do better with eating      Depression Screen PHQ 2/9 Scores 05/07/2020 12/25/2019  03/10/2019 02/28/2018 01/02/2017 06/29/2016  PHQ - 2 Score 0 0 0 0 0 0  PHQ- 9 Score - 0 - - - -    Fall Risk Fall Risk  05/07/2020 12/25/2019 03/10/2019 02/28/2018 01/02/2017  Falls in the past year? 0 0 0 0 No  Number falls in past yr: - - 0 0 -  Injury with Fall? - - 0 - -  Risk for fall due to : No Fall Risks - - - -  Follow up Education provided;Falls evaluation completed;Falls prevention discussed Falls evaluation completed - - -    FALL RISK PREVENTION PERTAINING TO THE HOME:  Any stairs in or around the home? Yes  If so, are there any without handrails? Yes  Home free of loose throw rugs in walkways, pet beds, electrical cords, etc? Yes  Adequate lighting in your home to reduce risk of falls? Yes   ASSISTIVE DEVICES UTILIZED TO PREVENT FALLS:  Life alert? No  Use of a cane, walker or w/c? No  Grab bars in the bathroom? No  Shower chair or bench in shower? No  Elevated toilet seat or a handicapped toilet? No   TIMED UP AND GO:  Was the test performed? No   Cognitive Function:     6CIT Screen 05/07/2020 02/28/2018 01/02/2017  What Year? 0 points - 0 points  What month? 0 points 0 points 0 points  What time? 0 points 0 points 0 points  Count back from 20 0 points 0 points 0 points  Months in reverse 0 points 0 points 0 points  Repeat phrase 0 points 0 points 4 points  Total  Score 0 - 4    Immunizations Immunization History  Administered Date(s) Administered  . Fluad Quad(high Dose 65+) 11/01/2018, 12/25/2019  . Influenza, High Dose Seasonal PF 12/10/2017  . PFIZER(Purple Top)SARS-COV-2 Vaccination 12/11/2019  . Pneumococcal Conjugate-13 01/02/2017  . Pneumococcal Polysaccharide-23 02/28/2018  . Tdap 10/29/2014    TDAP status: Up to date  Flu Vaccine status: Up to date  Pneumococcal vaccine status: Up to date  Covid-19 vaccine status: Completed vaccines  Qualifies for Shingles Vaccine? Yes   Zostavax completed No   Shingrix Completed?: No.    Education has been provided regarding the importance of this vaccine. Patient has been advised to call insurance company to determine out of pocket expense if they have not yet received this vaccine. Advised may also receive vaccine at local pharmacy or Health Dept. Verbalized acceptance and understanding.  Screening Tests Health Maintenance  Topic Date Due  . COVID-19 Vaccine (2 - Pfizer risk 4-dose series) 01/01/2020  . MAMMOGRAM  03/12/2021  . TETANUS/TDAP  10/28/2024  . COLONOSCOPY (Pts [id number] Insurance coverage will need to be confirmed)  12/31/2024  . INFLUENZA VACCINE  Completed  . DEXA SCAN  Completed  . PNA vac Low Risk Adult  Completed  . Hepatitis C Screening  Addressed  . HPV VACCINES  Aged Out    Health Maintenance  Health Maintenance Due  Topic Date Due  . COVID-19 Vaccine (2 - Pfizer risk 4-dose series) 01/01/2020    Colorectal cancer screening: Type of screening: Colonoscopy. Completed 01/01/2015. Repeat every 10 years  Mammogram status: scheduled 05/27/2020  Bone Density status: Completed 02/08/2017  Lung Cancer Screening: (Low Dose CT Chest recommended if Age 9-80 years, 30 pack-year currently smoking OR have quit w/in 15years.) does not qualify.   Lung Cancer Screening Referral: no  Additional Screening:  Hepatitis C Screening: does qualify; Completed 10/29/2014  Vision  Screening: Recommended annual ophthalmology exams for early detection of glaucoma and other disorders of the eye. Is the patient up to date with their annual eye exam?  Yes  Who is the provider or what is the name of the office in which the patient attends annual eye exams? [facility] If pt is not established with a provider, would they like to be referred to a provider to establish care? No .   Dental Screening: Recommended annual dental exams for proper oral hygiene  Community Resource Referral / Chronic Care Management: CRR required this visit?  No   CCM required this visit?  No      Plan:     I have personally reviewed and noted the following in the patient's chart:   . Medical and social history . Use of alcohol, tobacco or illicit drugs  . Current medications and supplements . Functional ability and status . Nutritional status . Physical activity . Advanced directives . List of other physicians . Hospitalizations, surgeries, and ER visits in previous 12 months . Vitals . Screenings to include cognitive, depression, and falls . Referrals and appointments  In addition, I have reviewed and discussed with patient certain preventive protocols, quality metrics, and best practice recommendations. A written personalized care plan for preventive services as well as general preventive health recommendations were provided to patient.     Barb Merino, LPN   7/00/1749   Nurse Notes:

## 2020-05-07 NOTE — Patient Instructions (Signed)
Ms. Heather Walsh , Thank you for taking time to come for your Medicare Wellness Visit. I appreciate your ongoing commitment to your health goals. Please review the following plan we discussed and let me know if I can assist you in the future.   Screening recommendations/referrals: Colonoscopy: completed 01/01/2015 Mammogram: scheduled 05/27/2020 Bone Density: completed 02/08/2017 Recommended yearly ophthalmology/optometry visit for glaucoma screening and checkup Recommended yearly dental visit for hygiene and checkup  Vaccinations: Influenza vaccine: completed 12/25/2019, due 09/20/2020 Pneumococcal vaccine: completed 02/28/2018 Tdap vaccine: completed 10/29/2014, due 10/28/2024 Shingles vaccine: discussed   Covid-19: 04/16/2019, 05/07/2019, 12/11/2019  Advanced directives: copy in chart  Conditions/risks identified: none  Next appointment: Follow up in one year for your annual wellness visit    Preventive Care 65 Years and Older, Female Preventive care refers to lifestyle choices and visits with your health care provider that can promote health and wellness. What does preventive care include?  A yearly physical exam. This is also called an annual well check.  Dental exams once or twice a year.  Routine eye exams. Ask your health care provider how often you should have your eyes checked.  Personal lifestyle choices, including:  Daily care of your teeth and gums.  Regular physical activity.  Eating a healthy diet.  Avoiding tobacco and drug use.  Limiting alcohol use.  Practicing safe sex.  Taking low-dose aspirin every day.  Taking vitamin and mineral supplements as recommended by your health care provider. What happens during an annual well check? The services and screenings done by your health care provider during your annual well check will depend on your age, overall health, lifestyle risk factors, and family history of disease. Counseling  Your health care provider may ask you  questions about your:  Alcohol use.  Tobacco use.  Drug use.  Emotional well-being.  Home and relationship well-being.  Sexual activity.  Eating habits.  History of falls.  Memory and ability to understand (cognition).  Work and work Astronomer.  Reproductive health. Screening  You may have the following tests or measurements:  Height, weight, and BMI.  Blood pressure.  Lipid and cholesterol levels. These may be checked every 5 years, or more frequently if you are over 64 years old.  Skin check.  Lung cancer screening. You may have this screening every year starting at age 39 if you have a 30-pack-year history of smoking and currently smoke or have quit within the past 15 years.  Fecal occult blood test (FOBT) of the stool. You may have this test every year starting at age 80.  Flexible sigmoidoscopy or colonoscopy. You may have a sigmoidoscopy every 5 years or a colonoscopy every 10 years starting at age 20.  Hepatitis C blood test.  Hepatitis B blood test.  Sexually transmitted disease (STD) testing.  Diabetes screening. This is done by checking your blood sugar (glucose) after you have not eaten for a while (fasting). You may have this done every 1-3 years.  Bone density scan. This is done to screen for osteoporosis. You may have this done starting at age 9.  Mammogram. This may be done every 1-2 years. Talk to your health care provider about how often you should have regular mammograms. Talk with your health care provider about your test results, treatment options, and if necessary, the need for more tests. Vaccines  Your health care provider may recommend certain vaccines, such as:  Influenza vaccine. This is recommended every year.  Tetanus, diphtheria, and acellular pertussis (Tdap, Td) vaccine.  You may need a Td booster every 10 years.  Zoster vaccine. You may need this after age 37.  Pneumococcal 13-valent conjugate (PCV13) vaccine. One dose is  recommended after age 77.  Pneumococcal polysaccharide (PPSV23) vaccine. One dose is recommended after age 10. Talk to your health care provider about which screenings and vaccines you need and how often you need them. This information is not intended to replace advice given to you by your health care provider. Make sure you discuss any questions you have with your health care provider. Document Released: 03/05/2015 Document Revised: 10/27/2015 Document Reviewed: 12/08/2014 Elsevier Interactive Patient Education  2017 [place] Prevention in the Home Falls can cause injuries. They can happen to people of all ages. There are many things you can do to make your home safe and to help prevent falls. What can I do on the outside of my home?  Regularly fix the edges of walkways and driveways and fix any cracks.  Remove anything that might make you trip as you walk through a door, such as a raised step or threshold.  Trim any bushes or trees on the path to your home.  Use bright outdoor lighting.  Clear any walking paths of anything that might make someone trip, such as rocks or tools.  Regularly check to see if handrails are loose or broken. Make sure that both sides of any steps have handrails.  Any raised decks and porches should have guardrails on the edges.  Have any leaves, snow, or ice cleared regularly.  Use sand or salt on walking paths during winter.  Clean up any spills in your garage right away. This includes oil or grease spills. What can I do in the bathroom?  Use night lights.  Install grab bars by the toilet and in the tub and shower. Do not use towel bars as grab bars.  Use non-skid mats or decals in the tub or shower.  If you need to sit down in the shower, use a plastic, non-slip stool.  Keep the floor dry. Clean up any water that spills on the floor as soon as it happens.  Remove soap buildup in the tub or shower regularly.  Attach bath mats  securely with double-sided non-slip rug tape.  Do not have throw rugs and other things on the floor that can make you trip. What can I do in the bedroom?  Use night lights.  Make sure that you have a light by your bed that is easy to reach.  Do not use any sheets or blankets that are too big for your bed. They should not hang down onto the floor.  Have a firm chair that has side arms. You can use this for support while you get dressed.  Do not have throw rugs and other things on the floor that can make you trip. What can I do in the kitchen?  Clean up any spills right away.  Avoid walking on wet floors.  Keep items that you use a lot in easy-to-reach places.  If you need to reach something above you, use a strong step stool that has a grab bar.  Keep electrical cords out of the way.  Do not use floor polish or wax that makes floors slippery. If you must use wax, use non-skid floor wax.  Do not have throw rugs and other things on the floor that can make you trip. What can I do with my stairs?  Do not leave any  items on the stairs.  Make sure that there are handrails on both sides of the stairs and use them. Fix handrails that are broken or loose. Make sure that handrails are as long as the stairways.  Check any carpeting to make sure that it is firmly attached to the stairs. Fix any carpet that is loose or worn.  Avoid having throw rugs at the top or bottom of the stairs. If you do have throw rugs, attach them to the floor with carpet tape.  Make sure that you have a light switch at the top of the stairs and the bottom of the stairs. If you do not have them, ask someone to add them for you. What else can I do to help prevent falls?  Wear shoes that:  Do not have high heels.  Have rubber bottoms.  Are comfortable and fit you well.  Are closed at the toe. Do not wear sandals.  If you use a stepladder:  Make sure that it is fully opened. Do not climb a closed  stepladder.  Make sure that both sides of the stepladder are locked into place.  Ask someone to hold it for you, if possible.  Clearly mark and make sure that you can see:  Any grab bars or handrails.  First and last steps.  Where the edge of each step is.  Use tools that help you move around (mobility aids) if they are needed. These include:  Canes.  Walkers.  Scooters.  Crutches.  Turn on the lights when you go into a dark area. Replace any light bulbs as soon as they burn out.  Set up your furniture so you have a clear path. Avoid moving your furniture around.  If any of your floors are uneven, fix them.  If there are any pets around you, be aware of where they are.  Review your medicines with your doctor. Some medicines can make you feel dizzy. This can increase your chance of falling. Ask your doctor what other things that you can do to help prevent falls. This information is not intended to replace advice given to you by your health care provider. Make sure you discuss any questions you have with your health care provider. Document Released: 12/03/2008 Document Revised: 07/15/2015 Document Reviewed: 03/13/2014 Elsevier Interactive Patient Education  2017 [employer].

## 2020-05-27 ENCOUNTER — Other Ambulatory Visit: Payer: Self-pay

## 2020-05-27 ENCOUNTER — Ambulatory Visit
Admission: RE | Admit: 2020-05-27 | Discharge: 2020-05-27 | Disposition: A | Discharge status: Home / Self Care | Payer: Medicare HMO | Source: Ambulatory Visit | Attending: Family Medicine | Admitting: Family Medicine

## 2020-05-27 DIAGNOSIS — Z1231 Encounter for screening mammogram for malignant neoplasm of breast: Secondary | ICD-10-CM | POA: Insufficient documentation

## 2020-06-24 ENCOUNTER — Other Ambulatory Visit: Payer: Self-pay

## 2020-06-24 ENCOUNTER — Ambulatory Visit (INDEPENDENT_AMBULATORY_CARE_PROVIDER_SITE_OTHER): Payer: Medicare HMO | Admitting: Family Medicine

## 2020-06-24 ENCOUNTER — Encounter: Payer: Self-pay | Admitting: Family Medicine

## 2020-06-24 VITALS — BP 107/62 | HR 74 | Temp 97.7°F | Ht 60.5 in | Wt 141.6 lb

## 2020-06-24 DIAGNOSIS — R519 Headache, unspecified: Secondary | ICD-10-CM | POA: Diagnosis not present

## 2020-06-24 DIAGNOSIS — E782 Mixed hyperlipidemia: Secondary | ICD-10-CM

## 2020-06-24 DIAGNOSIS — M810 Age-related osteoporosis without current pathological fracture: Secondary | ICD-10-CM | POA: Diagnosis not present

## 2020-06-24 DIAGNOSIS — Z0000 Encounter for general adult medical examination without abnormal findings: Secondary | ICD-10-CM

## 2020-06-24 DIAGNOSIS — Z87891 Personal history of nicotine dependence: Secondary | ICD-10-CM

## 2020-06-24 LAB — URINALYSIS, ROUTINE W REFLEX MICROSCOPIC
Bilirubin, UA: NEGATIVE
Glucose, UA: NEGATIVE
Ketones, UA: NEGATIVE
Nitrite, UA: NEGATIVE
Protein,UA: NEGATIVE
RBC, UA: NEGATIVE
Specific Gravity, UA: >1.03 — ABNORMAL HIGH (ref 1.005–1.030)
Urobilinogen, Ur: 0.2 mg/dL (ref 0.2–1.0)
pH, UA: 5.5 (ref 5.0–7.5)

## 2020-06-24 LAB — MICROSCOPIC EXAMINATION
Bacteria, UA: NONE SEEN
RBC, Urine: NONE SEEN /hpf (ref 0–2)

## 2020-06-24 NOTE — Assessment & Plan Note (Signed)
Due for DEXA. Ordered today. 

## 2020-06-24 NOTE — Progress Notes (Signed)
BP 107/62   Pulse 74   Temp 97.7 F (36.5 C)   Ht 5' 0.5" (1.537 m)   Wt 141 lb 9.6 oz (64.2 kg)   SpO2 97%   BMI 27.20 kg/m    Subjective:    Patient ID: Heather Walsh, female    DOB: 03/26/1950, 70 y.o.   MRN: [id number]  HPI: Heather Walsh is a 70 y.o. female presenting on 06/24/2020 for comprehensive medical examination. Current medical complaints include:  HYPERLIPIDEMIA Hyperlipidemia status: excellent compliance Satisfied with current treatment?  yes Side effects:  Not on anything Past cholesterol meds: none Supplements: red yeast rice Aspirin:  no The 10-year ASCVD risk score Mikey Bussing DC Jr., et al., 2013) is: 6.1%   Values used to calculate the score:     Age: 32 years     Sex: Female     Is Non-Hispanic African American: No     Diabetic: No     Tobacco smoker: No     Systolic Blood Pressure: 462 mmHg     Is BP treated: No     HDL Cholesterol: 82 mg/dL     Total Cholesterol: 268 mg/dL Chest pain:  no  Menopausal Symptoms: no  Depression Screen done today and results listed below:  Depression screen [facility] 2/9 06/24/2020 05/07/2020 12/25/2019 03/10/2019 02/28/2018  Decreased Interest 0 0 0 0 0  Down, Depressed, Hopeless 0 0 0 0 0  PHQ - 2 Score 0 0 0 0 0  Altered sleeping - - 0 - -  Tired, decreased energy - - 0 - -  Change in appetite - - 0 - -  Feeling bad or failure about yourself  - - 0 - -  Trouble concentrating - - 0 - -  Moving slowly or fidgety/restless - - 0 - -  Suicidal thoughts - - 0 - -  PHQ-9 Score - - 0 - -  Difficult doing work/chores - - Not difficult at all - -   Past Medical History:  Past Medical History:  Diagnosis Date  . Hyperlipidemia     Surgical History:  Past Surgical History:  Procedure Laterality Date  . CESAREAN SECTION     X 2  . COLONOSCOPY  01/01/2015  . DIAPHRAGMATIC HERNIA REPAIR  11/2010  . RIB FRACTURE SURGERY Left 07/2010   repair with fixation    Medications:  Current Outpatient Medications on File Prior to  Visit  Medication Sig  . Calcium-Magnesium-Vitamin D (CALCIUM 1200+D3 PO) Take 1 tablet by mouth daily.  . Magnesium 400 MG TABS Take 1 tablet by mouth daily.  . Red Yeast Rice 600 MG CAPS Take 2 capsules by mouth daily.  Marland Kitchen albuterol (PROVENTIL) (2.5 MG/3ML) 0.083% nebulizer solution Take 3 mLs (2.5 mg total) by nebulization every 6 (six) hours as needed for wheezing or shortness of breath. (Patient not taking: Reported on 06/24/2020)   No current facility-administered medications on file prior to visit.    Allergies:  Allergies  Allergen Reactions  . Sulfa Antibiotics Swelling    Social History:  Social History   Socioeconomic History  . Marital status: Divorced    Spouse name: Not on file  . Number of children: 2  . Years of education: Not on file  . Highest education level: Not on file  Occupational History  . Not on file  Tobacco Use  . Smoking status: Former Smoker    Packs/day: 1.00    Years: 20.00    Pack years: 20.00  Types: Cigarettes    Quit date: 06/02/1994    Years since quitting: 26.0  . Smokeless tobacco: Never Used  Vaping Use  . Vaping Use: Never used  Substance and Sexual Activity  . Alcohol use: No  . Drug use: Yes    Types: Marijuana  . Sexual activity: Not Currently  Other Topics Concern  . Not on file  Social History Narrative  . Not on file   Social Determinants of Health   Financial Resource Strain: Low Risk   . Difficulty of Paying Living Expenses: Not hard at all  Food Insecurity: No Food Insecurity  . Worried About Charity fundraiser in the Last Year: Never true  . Ran Out of Food in the Last Year: Never true  Transportation Needs: No Transportation Needs  . Lack of Transportation (Medical): No  . Lack of Transportation (Non-Medical): No  Physical Activity: Inactive  . Days of Exercise per Week: 0 days  . Minutes of Exercise per Session: 0 min  Stress: No Stress Concern Present  . Feeling of Stress : Not at all  Social  Connections: Not on file  Intimate Partner Violence: Not on file   Social History   Tobacco Use  Smoking Status Former Smoker  . Packs/day: 1.00  . Years: 20.00  . Pack years: 20.00  . Types: Cigarettes  . Quit date: 06/02/1994  . Years since quitting: 26.0  Smokeless Tobacco Never Used   Social History   Substance and Sexual Activity  Alcohol Use No    Family History:  Family History  Problem Relation Age of Onset  . CAD Mother   . Cancer Mother        lung  . Heart attack Father   . Heart disease Father   . Alcohol abuse Brother   . Cancer Brother        colon  . Cancer Son        bone  . Stroke Maternal Grandfather   . CAD Paternal Grandmother   . CAD Paternal Grandfather   . Cancer Sister        Throat  . Obesity Brother   . Alcohol abuse Son   . Breast cancer Neg Hx     Past medical history, surgical history, medications, allergies, family history and social history reviewed with patient today and changes made to appropriate areas of the chart.   Review of Systems  Constitutional: Negative.   HENT: Negative.   Eyes: Negative.   Respiratory: Negative.   Cardiovascular: Negative.   Gastrointestinal: Negative.   Genitourinary: Negative.   Musculoskeletal: Negative.   Skin: Negative.   Neurological: Positive for dizziness and headaches (shooting pain in her L temple). Negative for tingling, tremors, sensory change, speech change, focal weakness, seizures, loss of consciousness and weakness.  Endo/Heme/Allergies: Positive for environmental allergies. Negative for polydipsia. Does not bruise/bleed easily.  Psychiatric/Behavioral: Positive for depression. Negative for hallucinations, memory loss, substance abuse and suicidal ideas. The patient is not nervous/anxious and does not have insomnia.    All other ROS negative except what is listed above and in the HPI.      Objective:    BP 107/62   Pulse 74   Temp 97.7 F (36.5 C)   Ht 5' 0.5" (1.537 m)    Wt 141 lb 9.6 oz (64.2 kg)   SpO2 97%   BMI 27.20 kg/m   Wt Readings from Last 3 Encounters:  06/24/20 141 lb 9.6 oz (64.2 kg)  05/07/20 138 lb (62.6 kg)  12/25/19 141 lb (64 kg)    Physical Exam Vitals and nursing note reviewed.  Constitutional:      General: She is not in acute distress.    Appearance: Normal appearance. She is not ill-appearing, toxic-appearing or diaphoretic.  HENT:     Head: Normocephalic and atraumatic.     Right Ear: Tympanic membrane, ear canal and external ear normal. There is no impacted cerumen.     Left Ear: Tympanic membrane, ear canal and external ear normal. There is no impacted cerumen.     Nose: Nose normal. No congestion or rhinorrhea.     Mouth/Throat:     Mouth: Mucous membranes are moist.     Pharynx: Oropharynx is clear. No oropharyngeal exudate or posterior oropharyngeal erythema.  Eyes:     General: No scleral icterus.       Right eye: No discharge.        Left eye: No discharge.     Extraocular Movements: Extraocular movements intact.     Conjunctiva/sclera: Conjunctivae normal.     Pupils: Pupils are equal, round, and reactive to light.  Neck:     Vascular: No carotid bruit.  Cardiovascular:     Rate and Rhythm: Normal rate and regular rhythm.     Pulses: Normal pulses.     Heart sounds: No murmur heard. No friction rub. No gallop.   Pulmonary:     Effort: Pulmonary effort is normal. No respiratory distress.     Breath sounds: Normal breath sounds. No stridor. No wheezing, rhonchi or rales.  Chest:     Chest wall: No tenderness.  Abdominal:     General: Abdomen is flat. Bowel sounds are normal. There is no distension.     Palpations: Abdomen is soft. There is no mass.     Tenderness: There is no abdominal tenderness. There is no right CVA tenderness, left CVA tenderness, guarding or rebound.     Hernia: No hernia is present.  Genitourinary:    Comments: Breast and pelvic exams deferred with shared decision  making Musculoskeletal:        General: No swelling, tenderness, deformity or signs of injury.     Cervical back: Normal range of motion and neck supple. No rigidity. No muscular tenderness.     Right lower leg: No edema.     Left lower leg: No edema.  Lymphadenopathy:     Cervical: No cervical adenopathy.  Skin:    General: Skin is warm and dry.     Capillary Refill: Capillary refill takes less than 2 seconds.     Coloration: Skin is not jaundiced or pale.     Findings: No bruising, erythema, lesion or rash.  Neurological:     General: No focal deficit present.     Mental Status: She is alert and oriented to person, place, and time. Mental status is at baseline.     Cranial Nerves: No cranial nerve deficit.     Sensory: No sensory deficit.     Motor: No weakness.     Coordination: Coordination normal.     Gait: Gait normal.     Deep Tendon Reflexes: Reflexes normal.  Psychiatric:        Mood and Affect: Mood normal.        Behavior: Behavior normal.        Thought Content: Thought content normal.        Judgment: Judgment normal.     Results for  orders placed or performed in visit on 12/25/19  Comprehensive metabolic panel  Result Value Ref Range   Glucose 84 65 - 99 mg/dL   BUN 13 8 - 27 mg/dL   Creatinine, Ser 0.74 0.57 - 1.00 mg/dL   GFR calc non Af Amer 84 >59 mL/min/1.73   GFR calc Af Amer 96 >59 mL/min/1.73   BUN/Creatinine Ratio 18 12 - 28   Sodium 141 134 - 144 mmol/L   Potassium 4.5 3.5 - 5.2 mmol/L   Chloride 103 96 - 106 mmol/L   CO2 27 20 - 29 mmol/L   Calcium 9.4 8.7 - 10.3 mg/dL   Total Protein 6.6 6.0 - 8.5 g/dL   Albumin 4.7 3.8 - 4.8 g/dL   Globulin, Total 1.9 1.5 - 4.5 g/dL   Albumin/Globulin Ratio 2.5 (H) 1.2 - 2.2   Bilirubin Total 0.3 0.0 - 1.2 mg/dL   Alkaline Phosphatase 54 44 - 121 IU/L   AST 25 0 - 40 IU/L   ALT 17 0 - 32 IU/L  CBC with Differential/Platelet  Result Value Ref Range   WBC 4.7 3.4 - 10.8 x10E3/uL   RBC 4.29 3.77 - 5.28  x10E6/uL   Hemoglobin 13.3 11.1 - 15.9 g/dL   Hematocrit 40.4 34.0 - 46.6 %   MCV 94 79 - 97 fL   MCH 31.0 26.6 - 33.0 pg   MCHC 32.9 31.5 - 35.7 g/dL   RDW 13.5 11.7 - 15.4 %   Platelets 183 150 - 450 x10E3/uL   Neutrophils 61 Not Estab. %   Lymphs 28 Not Estab. %   Monocytes 9 Not Estab. %   Eos 1 Not Estab. %   Basos 1 Not Estab. %   Neutrophils Absolute 2.9 1.4 - 7.0 x10E3/uL   Lymphocytes Absolute 1.3 0.7 - 3.1 x10E3/uL   Monocytes Absolute 0.4 0.1 - 0.9 x10E3/uL   EOS (ABSOLUTE) 0.1 0.0 - 0.4 x10E3/uL   Basophils Absolute 0.0 0.0 - 0.2 x10E3/uL   Immature Granulocytes 0 Not Estab. %   Immature Grans (Abs) 0.0 0.0 - 0.1 x10E3/uL  Lipid Panel w/o Chol/HDL Ratio  Result Value Ref Range   Cholesterol, Total 268 (H) 100 - 199 mg/dL   Triglycerides 91 0 - 149 mg/dL   HDL 82 >39 mg/dL   VLDL Cholesterol Cal 15 5 - 40 mg/dL   LDL Chol Calc (NIH) 171 (H) 0 - 99 mg/dL      Assessment & Plan:   Problem List Items Addressed This Visit      Musculoskeletal and Integument   Osteoporosis    Due for DEXA. Ordered today.      Relevant Orders   CBC with Differential/Platelet   Comprehensive metabolic panel   TSH   DG Bone Density     Other   Hyperlipidemia    Has been doing metamucil. Rechecking labs today. Await results.       Relevant Orders   CBC with Differential/Platelet   Comprehensive metabolic panel   Lipid Panel w/o Chol/HDL Ratio    Other Visit Diagnoses    Routine general medical examination at a health care facility    -  Primary   Vaccines up to date. Screening labs checked today. DEXA ordere. Mammogram and colonoscopy up to date. Continue diet and exercise. Call with any concerns.    History of tobacco abuse       Labs drawn today. Await results.    Relevant Orders   Urinalysis, Routine w reflex microscopic  Acute nonintractable headache, unspecified headache type       Likley allergies related, but will check ESR- await results.    Relevant Orders    Sed Rate (ESR)       Follow up plan: Return in about 1 year (around 06/24/2021) for physical.   LABORATORY TESTING:  - Pap smear: not applicable  IMMUNIZATIONS:   - Tdap: Tetanus vaccination status reviewed: last tetanus booster within 10 years. - Influenza: Up to date - Pneumovax: Up to date - Prevnar: Up to date - COVID: Up to date  SCREENING: -Mammogram: Up to date  - Colonoscopy: Up to date  - Bone Density: Up to date   PATIENT COUNSELING:   Advised to take 1 mg of folate supplement per day if capable of pregnancy.   Sexuality: Discussed sexually transmitted diseases, partner selection, use of condoms, avoidance of unintended pregnancy  and contraceptive alternatives.   Advised to avoid cigarette smoking.  I discussed with the patient that most people either abstain from alcohol or drink within safe limits (<=14/week and <=4 drinks/occasion for males, <=7/weeks and <= 3 drinks/occasion for females) and that the risk for alcohol disorders and other health effects rises proportionally with the number of drinks per week and how often a drinker exceeds daily limits.  Discussed cessation/primary prevention of drug use and availability of treatment for abuse.   Diet: Encouraged to adjust caloric intake to maintain  or achieve ideal body weight, to reduce intake of dietary saturated fat and total fat, to limit sodium intake by avoiding high sodium foods and not adding table salt, and to maintain adequate dietary potassium and calcium preferably from fresh fruits, vegetables, and low-fat dairy products.    stressed the importance of regular exercise  Injury prevention: Discussed safety belts, safety helmets, smoke detector, smoking near bedding or upholstery.   Dental health: Discussed importance of regular tooth brushing, flossing, and dental visits.    NEXT PREVENTATIVE PHYSICAL DUE IN 1 YEAR. Return in about 1 year (around 06/24/2021) for physical.

## 2020-06-24 NOTE — Assessment & Plan Note (Signed)
Has been doing metamucil. Rechecking labs today. Await results.

## 2020-06-24 NOTE — Patient Instructions (Signed)
Health Maintenance After Age 69 After age 69, you are at a higher risk for certain long-term diseases and infections as well as injuries from falls. Falls are a major cause of broken bones and head injuries in people who are older than age 69. Getting regular preventive care can help to keep you healthy and well. Preventive care includes getting regular testing and making lifestyle changes as recommended by your health care provider. Talk with your health care provider about:  Which screenings and tests you should have. A screening is a test that checks for a disease when you have no symptoms.  A diet and exercise plan that is right for you. What should I know about screenings and tests to prevent falls? Screening and testing are the best ways to find a health problem early. Early diagnosis and treatment give you the best chance of managing medical conditions that are common after age 69. Certain conditions and lifestyle choices may make you more likely to have a fall. Your health care provider may recommend:  Regular vision checks. Poor vision and conditions such as cataracts can make you more likely to have a fall. If you wear glasses, make sure to get your prescription updated if your vision changes.  Medicine review. Work with your health care provider to regularly review all of the medicines you are taking, including over-the-counter medicines. Ask your health care provider about any side effects that may make you more likely to have a fall. Tell your health care provider if any medicines that you take make you feel dizzy or sleepy.  Osteoporosis screening. Osteoporosis is a condition that causes the bones to get weaker. This can make the bones weak and cause them to break more easily.  Blood pressure screening. Blood pressure changes and medicines to control blood pressure can make you feel dizzy.  Strength and balance checks. Your health care provider may recommend certain tests to check your  strength and balance while standing, walking, or changing positions.  Foot health exam. Foot pain and numbness, as well as not wearing proper footwear, can make you more likely to have a fall.  Depression screening. You may be more likely to have a fall if you have a fear of falling, feel emotionally low, or feel unable to do activities that you used to do.  Alcohol use screening. Using too much alcohol can affect your balance and may make you more likely to have a fall. What actions can I take to lower my risk of falls? General instructions  Talk with your health care provider about your risks for falling. Tell your health care provider if: ? You fall. Be sure to tell your health care provider about all falls, even ones that seem minor. ? You feel dizzy, sleepy, or off-balance.  Take over-the-counter and prescription medicines only as told by your health care provider. These include any supplements.  Eat a healthy diet and maintain a healthy weight. A healthy diet includes low-fat dairy products, low-fat (lean) meats, and fiber from whole grains, beans, and lots of fruits and vegetables. Home safety  Remove any tripping hazards, such as rugs, cords, and clutter.  Install safety equipment such as grab bars in bathrooms and safety rails on stairs.  Keep rooms and walkways well-lit. Activity  Follow a regular exercise program to stay fit. This will help you maintain your balance. Ask your health care provider what types of exercise are appropriate for you.  If you need a cane or walker,   use it as recommended by your health care provider.  Wear supportive shoes that have nonskid soles.   Lifestyle  Do not drink alcohol if your health care provider tells you not to drink.  If you drink alcohol, limit how much you have: ? 0-1 drink a day for women. ? 0-2 drinks a day for men.  Be aware of how much alcohol is in your drink. In the [place] one drink equals one typical bottle of beer (12  oz), one-half glass of wine (5 oz), or one shot of hard liquor (1 oz).  Do not use any products that contain nicotine or tobacco, such as cigarettes and e-cigarettes. If you need help quitting, ask your health care provider. Summary  Having a healthy lifestyle and getting preventive care can help to protect your health and wellness after age 69.  Screening and testing are the best way to find a health problem early and help you avoid having a fall. Early diagnosis and treatment give you the best chance for managing medical conditions that are more common for people who are older than age 69.  Falls are a major cause of broken bones and head injuries in people who are older than age 69. Take precautions to prevent a fall at home.  Work with your health care provider to learn what changes you can make to improve your health and wellness and to prevent falls. This information is not intended to replace advice given to you by your health care provider. Make sure you discuss any questions you have with your health care provider. Document Revised: 05/30/2018 Document Reviewed: 12/20/2016 Elsevier Patient Education  2021 Elsevier Inc.  

## 2020-06-25 ENCOUNTER — Encounter: Payer: Self-pay | Admitting: Family Medicine

## 2020-06-25 LAB — CBC WITH DIFFERENTIAL/PLATELET
Basophils Absolute: 0.1 10*3/uL (ref 0.0–0.2)
Basos: 1 %
EOS (ABSOLUTE): 0 10*3/uL (ref 0.0–0.4)
Eos: 0 %
Hematocrit: 40.6 % (ref 34.0–46.6)
Hemoglobin: 13.4 g/dL (ref 11.1–15.9)
Immature Grans (Abs): 0 10*3/uL (ref 0.0–0.1)
Immature Granulocytes: 0 %
Lymphocytes Absolute: 1.7 10*3/uL (ref 0.7–3.1)
Lymphs: 18 %
MCH: 30.9 pg (ref 26.6–33.0)
MCHC: 33 g/dL (ref 31.5–35.7)
MCV: 94 fL (ref 79–97)
Monocytes Absolute: 0.6 10*3/uL (ref 0.1–0.9)
Monocytes: 7 %
Neutrophils Absolute: 7.3 10*3/uL — ABNORMAL HIGH (ref 1.4–7.0)
Neutrophils: 74 %
Platelets: 140 10*3/uL — ABNORMAL LOW (ref 150–450)
RBC: 4.34 x10E6/uL (ref 3.77–5.28)
RDW: 13.9 % (ref 11.7–15.4)
WBC: 9.7 10*3/uL (ref 3.4–10.8)

## 2020-06-25 LAB — COMPREHENSIVE METABOLIC PANEL
ALT: 16 IU/L (ref 0–32)
AST: 18 IU/L (ref 0–40)
Albumin/Globulin Ratio: 2.3 — ABNORMAL HIGH (ref 1.2–2.2)
Albumin: 4.5 g/dL (ref 3.8–4.8)
Alkaline Phosphatase: 52 IU/L (ref 44–121)
BUN/Creatinine Ratio: 22 (ref 12–28)
BUN: 21 mg/dL (ref 8–27)
Bilirubin Total: 0.2 mg/dL (ref 0.0–1.2)
CO2: 24 mmol/L (ref 20–29)
Calcium: 9.2 mg/dL (ref 8.7–10.3)
Chloride: 103 mmol/L (ref 96–106)
Creatinine, Ser: 0.95 mg/dL (ref 0.57–1.00)
Globulin, Total: 2 g/dL (ref 1.5–4.5)
Glucose: 70 mg/dL (ref 65–99)
Potassium: 3.8 mmol/L (ref 3.5–5.2)
Sodium: 143 mmol/L (ref 134–144)
Total Protein: 6.5 g/dL (ref 6.0–8.5)
eGFR: 65 mL/min/{1.73_m2} (ref >59)

## 2020-06-25 LAB — LIPID PANEL W/O CHOL/HDL RATIO
Cholesterol, Total: 258 mg/dL — ABNORMAL HIGH (ref 100–199)
HDL: 80 mg/dL (ref >39)
LDL Chol Calc (NIH): 162 mg/dL — ABNORMAL HIGH (ref 0–99)
Triglycerides: 95 mg/dL (ref 0–149)
VLDL Cholesterol Cal: 16 mg/dL (ref 5–40)

## 2020-06-25 LAB — TSH: TSH: 1.1 u[IU]/mL (ref 0.450–4.500)

## 2020-06-25 LAB — SEDIMENTATION RATE: Sed Rate: 4 mm/hr (ref 0–40)

## 2020-07-15 DIAGNOSIS — H524 Presbyopia: Secondary | ICD-10-CM | POA: Diagnosis not present

## 2020-12-24 DIAGNOSIS — Z23 Encounter for immunization: Secondary | ICD-10-CM | POA: Diagnosis not present

## 2021-03-18 ENCOUNTER — Ambulatory Visit: Payer: Medicare HMO

## 2021-04-22 ENCOUNTER — Other Ambulatory Visit: Payer: Self-pay | Admitting: Family Medicine

## 2021-04-22 DIAGNOSIS — Z1231 Encounter for screening mammogram for malignant neoplasm of breast: Secondary | ICD-10-CM

## 2021-05-05 ENCOUNTER — Telehealth: Payer: Self-pay | Admitting: Family Medicine

## 2021-05-05 NOTE — Telephone Encounter (Signed)
Copied from CRM [id number]. Topic: Appointment Scheduling - Scheduling Inquiry for Clinic ?>> May 05, 2021 10:01 AM Marylen Ponto wrote: ?Reason for CRM: Pt stated she has to work and will not be available for the Medicare Well Visit on 05/09/21. ?

## 2021-05-09 ENCOUNTER — Ambulatory Visit: Payer: Medicare HMO

## 2021-05-12 ENCOUNTER — Ambulatory Visit (INDEPENDENT_AMBULATORY_CARE_PROVIDER_SITE_OTHER): Payer: Medicare HMO | Admitting: *Deleted

## 2021-05-12 ENCOUNTER — Other Ambulatory Visit: Payer: Self-pay | Admitting: Family Medicine

## 2021-05-12 DIAGNOSIS — Z0000 Encounter for general adult medical examination without abnormal findings: Secondary | ICD-10-CM | POA: Diagnosis not present

## 2021-05-12 DIAGNOSIS — M810 Age-related osteoporosis without current pathological fracture: Secondary | ICD-10-CM

## 2021-05-12 DIAGNOSIS — Z13820 Encounter for screening for osteoporosis: Secondary | ICD-10-CM

## 2021-05-12 NOTE — Progress Notes (Signed)
Subjective:   Heather Walsh is a 71 y.o. female who presents for Medicare Annual (Subsequent) preventive examination.  I connected with  Heather Walsh on 05/12/21 by a telephone enabled telemedicine application and verified that I am speaking with the correct person using two identifiers.   I discussed the limitations of evaluation and management by telemedicine. The patient expressed understanding and agreed to proceed.  Patient location: home  Provider location: Tele-Health not in office    Review of Systems     Cardiac Risk Factors include: advanced age (>24men, >4 women);sedentary lifestyle;family history of premature cardiovascular disease     Objective:    Today's Vitals   There is no height or weight on file to calculate BMI.     05/12/2021    9:49 AM 05/07/2020    2:30 PM 02/28/2018    2:05 PM  Advanced Directives  Does Patient Have a Medical Advance Directive? No Yes Yes  Type of Special educational needs teacher of [place];Living will Healthcare Power of Heather Walsh;Living will  Does patient want to make changes to medical advance directive?   No - Patient declined  Copy of Healthcare Power of Attorney in Chart?  Yes - validated most recent copy scanned in chart (See row information) Yes - validated most recent copy scanned in chart (See row information)  Would patient like information on creating a medical advance directive? No - Patient declined      Current Medications (verified) Outpatient Encounter Medications as of 05/12/2021  Medication Sig   albuterol (PROVENTIL) (2.5 MG/3ML) 0.083% nebulizer solution Take 3 mLs (2.5 mg total) by nebulization every 6 (six) hours as needed for wheezing or shortness of breath. (Patient not taking: Reported on 06/24/2020)   Calcium-Magnesium-Vitamin D (CALCIUM 1200+D3 PO) Take 1 tablet by mouth daily. (Patient not taking: Reported on 05/12/2021)   Magnesium 400 MG TABS Take 1 tablet by mouth daily. (Patient not taking:  Reported on 05/12/2021)   Probiotic Product (PRO-BIOTIC BLEND PO) Take by mouth. (Patient not taking: Reported on 05/12/2021)   Red Yeast Rice 600 MG CAPS Take 2 capsules by mouth daily. (Patient not taking: Reported on 05/12/2021)   No facility-administered encounter medications on file as of 05/12/2021.    Allergies (verified) Sulfa antibiotics   History: Past Medical History:  Diagnosis Date   Hyperlipidemia    Past Surgical History:  Procedure Laterality Date   CESAREAN SECTION     X 2   COLONOSCOPY  01/01/2015   DIAPHRAGMATIC HERNIA REPAIR  11/2010   RIB FRACTURE SURGERY Left 07/2010   repair with fixation   Family History  Problem Relation Age of Onset   CAD Mother    Cancer Mother        lung   Heart attack Father    Heart disease Father    Alcohol abuse Brother    Cancer Brother        colon   Cancer Son        bone   Stroke Maternal Grandfather    CAD Paternal Grandmother    CAD Paternal Grandfather    Cancer Sister        Throat   Obesity Brother    Alcohol abuse Son    Breast cancer Neg Hx    Social History   Socioeconomic History   Marital status: Divorced    Spouse name: Not on file   Number of children: 2   Years of education: Not on file   Highest education  level: Not on file  Occupational History   Not on file  Tobacco Use   Smoking status: Former    Packs/day: 1.00    Years: 20.00    Pack years: 20.00    Types: Cigarettes    Quit date: 06/02/1994    Years since quitting: 26.9   Smokeless tobacco: Never  Vaping Use   Vaping Use: Never used  Substance and Sexual Activity   Alcohol use: No   Drug use: Yes    Types: Marijuana   Sexual activity: Not Currently  Other Topics Concern   Not on file  Social History Narrative   Not on file   Social Determinants of Health   Financial Resource Strain: Low Risk    Difficulty of Paying Living Expenses: Not hard at all  Food Insecurity: No Food Insecurity   Worried About Programme researcher, broadcasting/film/video  in the Last Year: Never true   Ran Out of Food in the Last Year: Never true  Transportation Needs: No Transportation Needs   Lack of Transportation (Medical): No   Lack of Transportation (Non-Medical): No  Physical Activity: Inactive   Days of Exercise per Week: 0 days   Minutes of Exercise per Session: 0 min  Stress: No Stress Concern Present   Feeling of Stress : Not at all  Social Connections: Socially Isolated   Frequency of Communication with Friends and Family: Once a week   Frequency of Social Gatherings with Friends and Family: Once a week   Attends Religious Services: Never   Database administrator or Organizations: No   Attends Engineer, structural: Never   Marital Status: Divorced    Tobacco Counseling Counseling given: Not Answered   Clinical Intake:  Pre-visit preparation completed: Yes  Pain : 0-10     Nutritional Risks: None Diabetes: No  How often do you need to have someone help you when you read instructions, pamphlets, or other written materials from your doctor or pharmacy?: 1 - Never  Diabetic?  no  Interpreter Needed?: No  Information entered by :: Remi Haggard LPN   Activities of Daily Living    05/12/2021    9:53 AM 06/24/2020    9:19 AM  In your present state of health, do you have any difficulty performing the following activities:  Hearing? 0 0  Vision? 0 0  Difficulty concentrating or making decisions? 0 0  Walking or climbing stairs? 0 0  Dressing or bathing? 0 0  Doing errands, shopping? 0 0  Preparing Food and eating ? N   Using the Toilet? N   In the past six months, have you accidently leaked urine? N   Do you have problems with loss of bowel control? N   Managing your Medications? N   Managing your Finances? N   Housekeeping or managing your Housekeeping? N     Patient Care Team: Dorcas Carrow, DO as PCP - General (Family Medicine)  Indicate any recent Medical Services you may have received from other than  [facility] providers in the past year (date may be approximate).     Assessment:   This is a routine wellness examination for Heather Walsh.  Hearing/Vision screen Hearing Screening - Comments:: No trouble hearing Vision Screening - Comments:: Up to date [facility]  Dietary issues and exercise activities discussed: Current Exercise Habits: The patient does not participate in regular exercise at present   Goals Addressed  This Visit's Progress    Exercise 3x per week (30 min per time)   Not on track    Recommend starting a routine exercise program at least 3 days a week for 30-45 minutes at a time as tolerated.       Patient Stated   Not on track    05/07/2020, do better with eating     Weight (lb) < 200 lb (90.7 kg)         Depression Screen    05/12/2021    9:55 AM 06/24/2020    9:19 AM 05/07/2020    2:30 PM 12/25/2019    9:01 AM 03/10/2019    9:54 AM 02/28/2018    2:08 PM 01/02/2017    8:09 AM  PHQ 2/9 Scores  PHQ - 2 Score 0 0 0 0 0 0 0  PHQ- 9 Score    0       Fall Risk    05/12/2021    9:51 AM 06/24/2020    9:19 AM 05/07/2020    2:30 PM 12/25/2019    9:01 AM 03/10/2019    9:53 AM  Fall Risk   Falls in the past year? 0 0 0 0 0  Number falls in past yr: 0 0   0  Injury with Fall? 0 0   0  Risk for fall due to :  No Fall Risks No Fall Risks    Follow up Falls evaluation completed;Education provided;Falls prevention discussed Falls evaluation completed Education provided;Falls evaluation completed;Falls prevention discussed Falls evaluation completed     FALL RISK PREVENTION PERTAINING TO THE HOME:  Any stairs in or around the home? No  If so, are there any without handrails? No  Home free of loose throw rugs in walkways, pet beds, electrical cords, etc? Yes  Adequate lighting in your home to reduce risk of falls? Yes   ASSISTIVE DEVICES UTILIZED TO PREVENT FALLS:  Life alert? No  Use of a cane, walker or w/c? No  Grab bars in the bathroom? No  Shower  chair or bench in shower? No  Elevated toilet seat or a handicapped toilet? No   TIMED UP AND GO:  Was the test performed? No .    Cognitive Function:  Normal cognitive status assessed by direct observation by this Nurse Health Advisor. No abnormalities found.         05/07/2020    2:31 PM 02/28/2018    2:10 PM 01/02/2017    8:24 AM  6CIT Screen  What Year? 0 points  0 points  What month? 0 points 0 points 0 points  What time? 0 points 0 points 0 points  Count back from 20 0 points 0 points 0 points  Months in reverse 0 points 0 points 0 points  Repeat phrase 0 points 0 points 4 points  Total Score 0 points  4 points    Immunizations Immunization History  Administered Date(s) Administered   Fluad Quad(high Dose 65+) 11/01/2018, 12/25/2019   Influenza, High Dose Seasonal PF 12/10/2017, 12/24/2020   PFIZER(Purple Top)SARS-COV-2 Vaccination 04/16/2019, 05/07/2019, 12/11/2019   Pneumococcal Conjugate-13 01/02/2017   Pneumococcal Polysaccharide-23 02/28/2018   Tdap 10/29/2014    TDAP status: Up to date  Flu Vaccine status: Up to date  Pneumococcal vaccine status: Up to date  Covid-19 vaccine status: Information provided on how to obtain vaccines.   Qualifies for Shingles Vaccine? Yes   Zostavax completed No   Shingrix Completed?: No.    Education has been  provided regarding the importance of this vaccine. Patient has been advised to call insurance company to determine out of pocket expense if they have not yet received this vaccine. Advised may also receive vaccine at local pharmacy or Health Dept. Verbalized acceptance and understanding.  Screening Tests Health Maintenance  Topic Date Due   DEXA SCAN  02/09/2020   COVID-19 Vaccine (4 - Booster for Pfizer series) 05/28/2021 (Originally 02/05/2020)   Zoster Vaccines- Shingrix (1 of 2) 08/12/2021 (Originally 02/28/1969)   MAMMOGRAM  05/28/2022   TETANUS/TDAP  10/28/2024   COLONOSCOPY (Pts 45-28yrs Insurance coverage will  need to be confirmed)  12/31/2024   Pneumonia Vaccine 56+ Years old  Completed   INFLUENZA VACCINE  Completed   Hepatitis C Screening  Addressed   HPV VACCINES  Aged Out    Health Maintenance  Health Maintenance Due  Topic Date Due   DEXA SCAN  02/09/2020    Colorectal cancer screening: Type of screening: Colonoscopy. Completed 2016. Repeat every 10 years  Mammogram  scheduled  Bone Density status: Ordered  . Pt provided with contact info and advised to call to schedule appt.  Lung Cancer Screening: (Low Dose CT Chest recommended if Age 83-80 years, 30 pack-year currently smoking OR have quit w/in 15years.) does not qualify.   Lung Cancer Screening Referral:   Additional Screening:  Hepatitis C Screening: does not qualify; Completed 2016  Vision Screening: Recommended annual ophthalmology exams for early detection of glaucoma and other disorders of the eye. Is the patient up to date with their annual eye exam?  Yes  Who is the provider or what is the name of the office in which the patient attends annual eye exams? [facility] If pt is not established with a provider, would they like to be referred to a provider to establish care? No .   Dental Screening: Recommended annual dental exams for proper oral hygiene  Community Resource Referral / Chronic Care Management: CRR required this visit?  No   CCM required this visit?  No      Plan:     I have personally reviewed and noted the following in the patients chart:   Medical and social history Use of alcohol, tobacco or illicit drugs  Current medications and supplements including opioid prescriptions.  Functional ability and status Nutritional status Physical activity Advanced directives List of other physicians Hospitalizations, surgeries, and ER visits in previous 12 months Vitals Screenings to include cognitive, depression, and falls Referrals and appointments  In addition, I have reviewed and discussed  with patient certain preventive protocols, quality metrics, and best practice recommendations. A written personalized care plan for preventive services as well as general preventive health recommendations were provided to patient.     Remi Haggard, LPN   11/04/7827   Nurse Notes:

## 2021-05-12 NOTE — Patient Instructions (Signed)
Ms. Weatherbee , ?Thank you for taking time to come for your Medicare Wellness Visit. I appreciate your ongoing commitment to your health goals. Please review the following plan we discussed and let me know if I can assist you in the future.  ? ?Screening recommendations/referrals: ?Colonoscopy: up to date ?Mammogram: up to date ?Bone Density: Education provided ?Recommended yearly ophthalmology/optometry visit for glaucoma screening and checkup ?Recommended yearly dental visit for hygiene and checkup ? ?Vaccinations: ?Influenza vaccine: up to date ?Pneumococcal vaccine: up to date ?Tdap vaccine: up to date ?Shingles vaccine: Education provided   ? ?Advanced directives: Education provided ? ?Conditions/risks identified:  ? ? ? ? ?Preventive Care 41 Years and Older, Female ?Preventive care refers to lifestyle choices and visits with your health care provider that can promote health and wellness. ?What does preventive care include? ?A yearly physical exam. This is also called an annual well check. ?Dental exams once or twice a year. ?Routine eye exams. Ask your health care provider how often you should have your eyes checked. ?Personal lifestyle choices, including: ?Daily care of your teeth and gums. ?Regular physical activity. ?Eating a healthy diet. ?Avoiding tobacco and drug use. ?Limiting alcohol use. ?Practicing safe sex. ?Taking low-dose aspirin every day. ?Taking vitamin and mineral supplements as recommended by your health care provider. ?What happens during an annual well check? ?The services and screenings done by your health care provider during your annual well check will depend on your age, overall health, lifestyle risk factors, and family history of disease. ?Counseling  ?Your health care provider may ask you questions about your: ?Alcohol use. ?Tobacco use. ?Drug use. ?Emotional well-being. ?Home and relationship well-being. ?Sexual activity. ?Eating habits. ?History of falls. ?Memory and ability to  understand (cognition). ?Work and work Astronomer. ?Reproductive health. ?Screening  ?You may have the following tests or measurements: ?Height, weight, and BMI. ?Blood pressure. ?Lipid and cholesterol levels. These may be checked every 5 years, or more frequently if you are over 34 years old. ?Skin check. ?Lung cancer screening. You may have this screening every year starting at age 70 if you have a 30-pack-year history of smoking and currently smoke or have quit within the past 15 years. ?Fecal occult blood test (FOBT) of the stool. You may have this test every year starting at age 63. ?Flexible sigmoidoscopy or colonoscopy. You may have a sigmoidoscopy every 5 years or a colonoscopy every 10 years starting at age 11. ?Hepatitis C blood test. ?Hepatitis B blood test. ?Sexually transmitted disease (STD) testing. ?Diabetes screening. This is done by checking your blood sugar (glucose) after you have not eaten for a while (fasting). You may have this done every 1-3 years. ?Bone density scan. This is done to screen for osteoporosis. You may have this done starting at age 81. ?Mammogram. This may be done every 1-2 years. Talk to your health care provider about how often you should have regular mammograms. ?Talk with your health care provider about your test results, treatment options, and if necessary, the need for more tests. ?Vaccines  ?Your health care provider may recommend certain vaccines, such as: ?Influenza vaccine. This is recommended every year. ?Tetanus, diphtheria, and acellular pertussis (Tdap, Td) vaccine. You may need a Td booster every 10 years. ?Zoster vaccine. You may need this after age 13. ?Pneumococcal 13-valent conjugate (PCV13) vaccine. One dose is recommended after age 38. ?Pneumococcal polysaccharide (PPSV23) vaccine. One dose is recommended after age 55. ?Talk to your health care provider about which screenings and vaccines you  need and how often you need them. ?This information is not  intended to replace advice given to you by your health care provider. Make sure you discuss any questions you have with your health care provider. ?Document Released: 03/05/2015 Document Revised: 10/27/2015 Document Reviewed: 12/08/2014 ?Elsevier Interactive Patient Education ? 2017 [employer]. ? ?Fall Prevention in the Home ?Falls can cause injuries. They can happen to people of all ages. There are many things you can do to make your home safe and to help prevent falls. ?What can I do on the outside of my home? ?Regularly fix the edges of walkways and driveways and fix any cracks. ?Remove anything that might make you trip as you walk through a door, such as a raised step or threshold. ?Trim any bushes or trees on the path to your home. ?Use bright outdoor lighting. ?Clear any walking paths of anything that might make someone trip, such as rocks or tools. ?Regularly check to see if handrails are loose or broken. Make sure that both sides of any steps have handrails. ?Any raised decks and porches should have guardrails on the edges. ?Have any leaves, snow, or ice cleared regularly. ?Use sand or salt on walking paths during winter. ?Clean up any spills in your garage right away. This includes oil or grease spills. ?What can I do in the bathroom? ?Use night lights. ?Install grab bars by the toilet and in the tub and shower. Do not use towel bars as grab bars. ?Use non-skid mats or decals in the tub or shower. ?If you need to sit down in the shower, use a plastic, non-slip stool. ?Keep the floor dry. Clean up any water that spills on the floor as soon as it happens. ?Remove soap buildup in the tub or shower regularly. ?Attach bath mats securely with double-sided non-slip rug tape. ?Do not have throw rugs and other things on the floor that can make you trip. ?What can I do in the bedroom? ?Use night lights. ?Make sure that you have a light by your bed that is easy to reach. ?Do not use any sheets or blankets that are  too big for your bed. They should not hang down onto the floor. ?Have a firm chair that has side arms. You can use this for support while you get dressed. ?Do not have throw rugs and other things on the floor that can make you trip. ?What can I do in the kitchen? ?Clean up any spills right away. ?Avoid walking on wet floors. ?Keep items that you use a lot in easy-to-reach places. ?If you need to reach something above you, use a strong step stool that has a grab bar. ?Keep electrical cords out of the way. ?Do not use floor polish or wax that makes floors slippery. If you must use wax, use non-skid floor wax. ?Do not have throw rugs and other things on the floor that can make you trip. ?What can I do with my stairs? ?Do not leave any items on the stairs. ?Make sure that there are handrails on both sides of the stairs and use them. Fix handrails that are broken or loose. Make sure that handrails are as long as the stairways. ?Check any carpeting to make sure that it is firmly attached to the stairs. Fix any carpet that is loose or worn. ?Avoid having throw rugs at the top or bottom of the stairs. If you do have throw rugs, attach them to the floor with carpet tape. ?Make sure that  you have a light switch at the top of the stairs and the bottom of the stairs. If you do not have them, ask someone to add them for you. ?What else can I do to help prevent falls? ?Wear shoes that: ?Do not have high heels. ?Have rubber bottoms. ?Are comfortable and fit you well. ?Are closed at the toe. Do not wear sandals. ?If you use a stepladder: ?Make sure that it is fully opened. Do not climb a closed stepladder. ?Make sure that both sides of the stepladder are locked into place. ?Ask someone to hold it for you, if possible. ?Clearly mark and make sure that you can see: ?Any grab bars or handrails. ?First and last steps. ?Where the edge of each step is. ?Use tools that help you move around (mobility aids) if they are needed. These  include: ?Canes. ?Walkers. ?Scooters. ?Crutches. ?Turn on the lights when you go into a dark area. Replace any light bulbs as soon as they burn out. ?Set up your furniture so you have a clear path. Avoid moving your furn

## 2021-05-16 NOTE — Progress Notes (Signed)
I, as supervising physician, have reviewed the nurse health advisor's Medicare Wellness Visit note for this patient and concur with the findings and recommendations listed above.   

## 2021-06-30 ENCOUNTER — Ambulatory Visit (INDEPENDENT_AMBULATORY_CARE_PROVIDER_SITE_OTHER): Payer: Medicare HMO | Admitting: Family Medicine

## 2021-06-30 ENCOUNTER — Encounter: Payer: Self-pay | Admitting: Family Medicine

## 2021-06-30 VITALS — BP 118/77 | HR 58 | Temp 97.6°F | Ht 62.5 in | Wt 140.8 lb

## 2021-06-30 DIAGNOSIS — D692 Other nonthrombocytopenic purpura: Secondary | ICD-10-CM | POA: Insufficient documentation

## 2021-06-30 DIAGNOSIS — E782 Mixed hyperlipidemia: Secondary | ICD-10-CM

## 2021-06-30 DIAGNOSIS — Z87891 Personal history of nicotine dependence: Secondary | ICD-10-CM | POA: Diagnosis not present

## 2021-06-30 DIAGNOSIS — Z0000 Encounter for general adult medical examination without abnormal findings: Secondary | ICD-10-CM

## 2021-06-30 DIAGNOSIS — R5383 Other fatigue: Secondary | ICD-10-CM

## 2021-06-30 LAB — URINALYSIS, ROUTINE W REFLEX MICROSCOPIC
Bilirubin, UA: NEGATIVE
Glucose, UA: NEGATIVE
Ketones, UA: NEGATIVE
Nitrite, UA: NEGATIVE
Protein,UA: NEGATIVE
RBC, UA: NEGATIVE
Specific Gravity, UA: 1.025 (ref 1.005–1.030)
Urobilinogen, Ur: 0.2 mg/dL (ref 0.2–1.0)
pH, UA: 6.5 (ref 5.0–7.5)

## 2021-06-30 LAB — MICROSCOPIC EXAMINATION
Epithelial Cells (non renal): NONE SEEN /hpf (ref 0–10)
RBC, Urine: NONE SEEN /hpf (ref 0–2)

## 2021-06-30 NOTE — Assessment & Plan Note (Signed)
Stable. Continue to monitor. Call with any concerns.  ?

## 2021-06-30 NOTE — Assessment & Plan Note (Signed)
Checking labs today. Await results.  

## 2021-06-30 NOTE — Progress Notes (Signed)
? ?BP 118/77   Pulse (!) 58   Temp 97.6 ?F (36.4 ?C)   Ht 5' 2.5" (1.588 m)   Wt 140 lb 12.8 oz (63.9 kg)   SpO2 96%   BMI 25.34 kg/m?   ? ?Subjective:  ? ? Patient ID: Heather Walsh, female    DOB: 01/04/1951, 71 y.o.   MRN: [id number] ? ?HPI: ?Heather Walsh is a 71 y.o. female presenting on 06/30/2021 for comprehensive medical examination. Current medical complaints include:none ? ?Menopausal Symptoms: no ? ?Depression Screen done today and results listed below:  ? ?  06/30/2021  ? 10:10 AM 05/12/2021  ?  9:55 AM 06/24/2020  ?  9:19 AM 05/07/2020  ?  2:30 PM 12/25/2019  ?  9:01 AM  ?Depression screen PHQ 2/9  ?Decreased Interest 0 0 0 0 0  ?Down, Depressed, Hopeless 0 0 0 0 0  ?PHQ - 2 Score 0 0 0 0 0  ?Altered sleeping 0    0  ?Tired, decreased energy 1    0  ?Change in appetite 1    0  ?Feeling bad or failure about yourself  1    0  ?Trouble concentrating 1    0  ?Moving slowly or fidgety/restless 0    0  ?Suicidal thoughts 0    0  ?PHQ-9 Score 4    0  ?Difficult doing work/chores     Not difficult at all  ? ? ?Past Medical History:  ?Past Medical History:  ?Diagnosis Date  ? Hyperlipidemia   ? ? ?Surgical History:  ?Past Surgical History:  ?Procedure Laterality Date  ? CESAREAN SECTION    ? X 2  ? COLONOSCOPY  01/01/2015  ? DIAPHRAGMATIC HERNIA REPAIR  11/2010  ? RIB FRACTURE SURGERY Left 07/2010  ? repair with fixation  ? ? ?Medications:  ?Current Outpatient Medications on File Prior to Visit  ?Medication Sig  ? cholecalciferol (VITAMIN D3) 25 MCG (1000 UNIT) tablet Take 1,000 Units by mouth daily.  ? Probiotic Product (PRO-BIOTIC BLEND PO) Take by mouth.  ? Red Yeast Rice 600 MG CAPS Take 2 capsules by mouth daily.  ? ?No current facility-administered medications on file prior to visit.  ? ? ?Allergies:  ?Allergies  ?Allergen Reactions  ? Sulfa Antibiotics Swelling  ? ? ?Social History:  ?Social History  ? ?Socioeconomic History  ? Marital status: Divorced  ?  Spouse name: Not on file  ? Number of children:  2  ? Years of education: Not on file  ? Highest education level: Not on file  ?Occupational History  ? Not on file  ?Tobacco Use  ? Smoking status: Former  ?  Packs/day: 1.00  ?  Years: 20.00  ?  Pack years: 20.00  ?  Types: Cigarettes  ?  Quit date: 06/02/1994  ?  Years since quitting: 27.0  ? Smokeless tobacco: Never  ?Vaping Use  ? Vaping Use: Never used  ?Substance and Sexual Activity  ? Alcohol use: No  ? Drug use: Yes  ?  Types: Marijuana  ? Sexual activity: Not Currently  ?Other Topics Concern  ? Not on file  ?Social History Narrative  ? Not on file  ? ?Social Determinants of Health  ? ?Financial Resource Strain: Low Risk   ? Difficulty of Paying Living Expenses: Not hard at all  ?Food Insecurity: No Food Insecurity  ? Worried About Programme researcher, broadcasting/film/videounning Out of Food in the Last Year: Never true  ? Ran Out of Food in the  Last Year: Never true  ?Transportation Needs: No Transportation Needs  ? Lack of Transportation (Medical): No  ? Lack of Transportation (Non-Medical): No  ?Physical Activity: Inactive  ? Days of Exercise per Week: 0 days  ? Minutes of Exercise per Session: 0 min  ?Stress: No Stress Concern Present  ? Feeling of Stress : Not at all  ?Social Connections: Socially Isolated  ? Frequency of Communication with Friends and Family: Once a week  ? Frequency of Social Gatherings with Friends and Family: Once a week  ? Attends Religious Services: Never  ? Active Member of Clubs or Organizations: No  ? Attends Banker Meetings: Never  ? Marital Status: Divorced  ?Intimate Partner Violence: Not At Risk  ? Fear of Current or Ex-Partner: No  ? Emotionally Abused: No  ? Physically Abused: No  ? Sexually Abused: No  ? ?Social History  ? ?Tobacco Use  ?Smoking Status Former  ? Packs/day: 1.00  ? Years: 20.00  ? Pack years: 20.00  ? Types: Cigarettes  ? Quit date: 06/02/1994  ? Years since quitting: 27.0  ?Smokeless Tobacco Never  ? ?Social History  ? ?Substance and Sexual Activity  ?Alcohol Use No  ? ? ?Family  History:  ?Family History  ?Problem Relation Age of Onset  ? CAD Mother   ? Cancer Mother   ?     lung  ? Heart attack Father   ? Heart disease Father   ? Alcohol abuse Brother   ? Cancer Brother   ?     colon  ? Cancer Son   ?     bone  ? Stroke Maternal Grandfather   ? CAD Paternal Grandmother   ? CAD Paternal Grandfather   ? Cancer Sister   ?     Throat  ? Obesity Brother   ? Alcohol abuse Son   ? Breast cancer Neg Hx   ? ? ?Past medical history, surgical history, medications, allergies, family history and social history reviewed with patient today and changes made to appropriate areas of the chart.  ? ?Review of Systems  ?Constitutional: Negative.   ?HENT: Negative.    ?Eyes: Negative.   ?Respiratory:  Positive for shortness of breath. Negative for cough, hemoptysis, sputum production and wheezing.   ?Cardiovascular: Negative.   ?Gastrointestinal: Negative.   ?Genitourinary: Negative.   ?Musculoskeletal:  Positive for joint pain and myalgias. Negative for back pain, falls and neck pain.  ?Skin: Negative.   ?Neurological:  Positive for dizziness. Negative for tingling, tremors, sensory change, speech change, focal weakness, seizures, loss of consciousness, weakness and headaches.  ?Endo/Heme/Allergies:  Negative for environmental allergies and polydipsia. Bruises/bleeds easily.  ?Psychiatric/Behavioral: Negative.    ?     Has been blue- but doing OK  ?All other ROS negative except what is listed above and in the HPI.  ? ?   ?Objective:  ?  ?BP 118/77   Pulse (!) 58   Temp 97.6 ?F (36.4 ?C)   Ht 5' 2.5" (1.588 m)   Wt 140 lb 12.8 oz (63.9 kg)   SpO2 96%   BMI 25.34 kg/m?   ?Wt Readings from Last 3 Encounters:  ?06/30/21 140 lb 12.8 oz (63.9 kg)  ?06/24/20 141 lb 9.6 oz (64.2 kg)  ?05/07/20 138 lb (62.6 kg)  ?  ?Physical Exam ?Vitals and nursing note reviewed.  ?Constitutional:   ?   General: She is not in acute distress. ?   Appearance: Normal appearance. She is  not ill-appearing, toxic-appearing or  diaphoretic.  ?HENT:  ?   Head: Normocephalic and atraumatic.  ?   Right Ear: Tympanic membrane, ear canal and external ear normal. There is no impacted cerumen.  ?   Left Ear: Tympanic membrane, ear canal and external ear normal. There is no impacted cerumen.  ?   Nose: Nose normal. No congestion or rhinorrhea.  ?   Mouth/Throat:  ?   Mouth: Mucous membranes are moist.  ?   Pharynx: Oropharynx is clear. No oropharyngeal exudate or posterior oropharyngeal erythema.  ?Eyes:  ?   General: No scleral icterus.    ?   Right eye: No discharge.     ?   Left eye: No discharge.  ?   Extraocular Movements: Extraocular movements intact.  ?   Conjunctiva/sclera: Conjunctivae normal.  ?   Pupils: Pupils are equal, round, and reactive to light.  ?Neck:  ?   Vascular: No carotid bruit.  ?Cardiovascular:  ?   Rate and Rhythm: Normal rate and regular rhythm.  ?   Pulses: Normal pulses.  ?   Heart sounds: No murmur heard. ?  No friction rub. No gallop.  ?Pulmonary:  ?   Effort: Pulmonary effort is normal. No respiratory distress.  ?   Breath sounds: Normal breath sounds. No stridor. No wheezing, rhonchi or rales.  ?Chest:  ?   Chest wall: No tenderness.  ?Abdominal:  ?   General: Abdomen is flat. Bowel sounds are normal. There is no distension.  ?   Palpations: Abdomen is soft. There is no mass.  ?   Tenderness: There is no abdominal tenderness. There is no right CVA tenderness, left CVA tenderness, guarding or rebound.  ?   Hernia: No hernia is present.  ?Genitourinary: ?   Comments: Breast and pelvic exams deferred with shared decision making ?Musculoskeletal:     ?   General: No swelling, tenderness, deformity or signs of injury. Normal range of motion.  ?   Cervical back: Normal range of motion and neck supple. No rigidity. No muscular tenderness.  ?   Right lower leg: No edema.  ?   Left lower leg: No edema.  ?Lymphadenopathy:  ?   Cervical: No cervical adenopathy.  ?Skin: ?   General: Skin is warm and dry.  ?   Capillary  Refill: Capillary refill takes less than 2 seconds.  ?   Coloration: Skin is not jaundiced or pale.  ?   Findings: No bruising, erythema, lesion or rash.  ?Neurological:  ?   General: No focal deficit present.  ?   Mental Status:

## 2021-07-01 ENCOUNTER — Encounter: Payer: Self-pay | Admitting: Family Medicine

## 2021-07-01 LAB — COMPREHENSIVE METABOLIC PANEL
ALT: 17 IU/L (ref 0–32)
AST: 24 IU/L (ref 0–40)
Albumin/Globulin Ratio: 1.9 (ref 1.2–2.2)
Albumin: 4.3 g/dL (ref 3.8–4.8)
Alkaline Phosphatase: 56 IU/L (ref 44–121)
BUN/Creatinine Ratio: 14 (ref 12–28)
BUN: 11 mg/dL (ref 8–27)
Bilirubin Total: 0.3 mg/dL (ref 0.0–1.2)
CO2: 26 mmol/L (ref 20–29)
Calcium: 9.4 mg/dL (ref 8.7–10.3)
Chloride: 102 mmol/L (ref 96–106)
Creatinine, Ser: 0.78 mg/dL (ref 0.57–1.00)
Globulin, Total: 2.3 g/dL (ref 1.5–4.5)
Glucose: 79 mg/dL (ref 70–99)
Potassium: 4.1 mmol/L (ref 3.5–5.2)
Sodium: 142 mmol/L (ref 134–144)
Total Protein: 6.6 g/dL (ref 6.0–8.5)
eGFR: 82 mL/min/{1.73_m2} (ref >59)

## 2021-07-01 LAB — CBC WITH DIFFERENTIAL/PLATELET
Basophils Absolute: 0 10*3/uL (ref 0.0–0.2)
Basos: 1 %
EOS (ABSOLUTE): 0.1 10*3/uL (ref 0.0–0.4)
Eos: 1 %
Hematocrit: 41.7 % (ref 34.0–46.6)
Hemoglobin: 13.8 g/dL (ref 11.1–15.9)
Immature Grans (Abs): 0 10*3/uL (ref 0.0–0.1)
Immature Granulocytes: 0 %
Lymphocytes Absolute: 1.7 10*3/uL (ref 0.7–3.1)
Lymphs: 25 %
MCH: 30.7 pg (ref 26.6–33.0)
MCHC: 33.1 g/dL (ref 31.5–35.7)
MCV: 93 fL (ref 79–97)
Monocytes Absolute: 0.5 10*3/uL (ref 0.1–0.9)
Monocytes: 7 %
Neutrophils Absolute: 4.5 10*3/uL (ref 1.4–7.0)
Neutrophils: 66 %
Platelets: 151 10*3/uL (ref 150–450)
RBC: 4.5 x10E6/uL (ref 3.77–5.28)
RDW: 13.3 % (ref 11.7–15.4)
WBC: 6.7 10*3/uL (ref 3.4–10.8)

## 2021-07-01 LAB — TSH: TSH: 1.16 u[IU]/mL (ref 0.450–4.500)

## 2021-07-01 LAB — LIPID PANEL W/O CHOL/HDL RATIO
Cholesterol, Total: 261 mg/dL — ABNORMAL HIGH (ref 100–199)
HDL: 81 mg/dL (ref >39)
LDL Chol Calc (NIH): 166 mg/dL — ABNORMAL HIGH (ref 0–99)
Triglycerides: 86 mg/dL (ref 0–149)
VLDL Cholesterol Cal: 14 mg/dL (ref 5–40)

## 2021-07-06 ENCOUNTER — Ambulatory Visit
Admission: RE | Admit: 2021-07-06 | Discharge: 2021-07-06 | Disposition: A | Discharge status: Home / Self Care | Payer: Medicare HMO | Source: Ambulatory Visit | Attending: Family Medicine | Admitting: Family Medicine

## 2021-07-06 DIAGNOSIS — Z13820 Encounter for screening for osteoporosis: Secondary | ICD-10-CM | POA: Insufficient documentation

## 2021-07-06 DIAGNOSIS — Z780 Asymptomatic menopausal state: Secondary | ICD-10-CM | POA: Diagnosis not present

## 2021-07-06 DIAGNOSIS — Z1231 Encounter for screening mammogram for malignant neoplasm of breast: Secondary | ICD-10-CM | POA: Diagnosis not present

## 2021-07-06 DIAGNOSIS — M810 Age-related osteoporosis without current pathological fracture: Secondary | ICD-10-CM | POA: Diagnosis not present

## 2021-10-07 DIAGNOSIS — H524 Presbyopia: Secondary | ICD-10-CM | POA: Diagnosis not present

## 2021-11-04 DIAGNOSIS — H524 Presbyopia: Secondary | ICD-10-CM | POA: Diagnosis not present

## 2022-01-18 ENCOUNTER — Encounter: Payer: Self-pay | Admitting: Nurse Practitioner

## 2022-01-18 ENCOUNTER — Ambulatory Visit: Payer: Self-pay | Admitting: *Deleted

## 2022-01-18 ENCOUNTER — Telehealth (INDEPENDENT_AMBULATORY_CARE_PROVIDER_SITE_OTHER): Payer: Medicare HMO | Admitting: Nurse Practitioner

## 2022-01-18 DIAGNOSIS — U071 COVID-19: Secondary | ICD-10-CM | POA: Insufficient documentation

## 2022-01-18 MED ORDER — AZITHROMYCIN 250 MG PO TABS: ORAL_TABLET | ORAL | 0 refills | Status: AC

## 2022-01-18 NOTE — Progress Notes (Signed)
There were no vitals taken for this visit.   Subjective:    Patient ID: Heather Walsh, female    DOB: 1950/10/30, 71 y.o.   MRN: [id number]  HPI: Heather Walsh is a 71 y.o. female  Chief Complaint  Patient presents with   Covid Positive    Tested positive on Thanksgiving. Symptoms started on Tuesday last week. Patient states that she is still having a little dizziness and a cough.  Patient has been taking a cough expectorant which is helping   This visit was completed via video visit through MyChart due to the restrictions of the COVID-19 pandemic. All issues as above were discussed and addressed. Physical exam was done as above through visual confirmation on video through MyChart. If it was felt that the patient should be evaluated in the office, they were directed there. The patient verbally consented to this visit. Location of the patient: home Location of the provider: work Those involved with this call:  Provider: Marnee Guarneri, DNP CMA: Frazier Butt, [place] Desk/Registration: [employer]  Time spent on call:  21 minutes with patient face to face via video conference. More than 50% of this time was spent in counseling and coordination of care. 15 minutes total spent in review of patient's record and preparation of their chart.  I verified patient identity using two factors (patient name and date of birth). Patient consents verbally to being seen via telemedicine visit today.    COVID POSITIVE Symptoms started 01/10/22.  Tested positive for Covid on 01/12/22 (7 days ago) and tested positive again Sunday.  Continues to have a mild cough and dizziness.  Ears were hurting this morning -- concerned her eustachian tubes are blocked. Took some ASA which improved it.   Fever: no Cough:  a little bit, but improving -- worse at night Shortness of breath:  with heavy coughing trying to get phlegm up Wheezing: no Chest pain: yes, with cough Chest tightness: no Chest congestion:  no Nasal congestion: no Runny nose: no Post nasal drip: no Sneezing: no Sore throat:  a little from cough Swollen glands: no Sinus pressure: no Headache:  mild Face pain: no Toothache: no Ear pain: yes "right Ear pressure: yes "right Eyes red/itching:no Eye drainage/crusting: no  Vomiting: no Rash: no Fatigue: no Sick contacts: yes son had Covid Strep contacts: no  Context: stable Recurrent sinusitis: no Relief with OTC cold/cough medications: yes  Treatments attempted: Green Tea with honey + Zinc    Relevant past medical, surgical, family and social history reviewed and updated as indicated. Interim medical history since our last visit reviewed. Allergies and medications reviewed and updated.  Review of Systems  Constitutional:  Positive for fatigue. Negative for activity change, appetite change and fever.  HENT:  Positive for ear pain (mild and improved with ASA). Negative for congestion, ear discharge, facial swelling, postnasal drip, rhinorrhea, sinus pressure, sinus pain, sneezing, sore throat and voice change.   Respiratory:  Positive for cough. Negative for chest tightness, shortness of breath and wheezing.   Cardiovascular:  Negative for chest pain, palpitations and leg swelling.  Gastrointestinal: Negative.   Neurological: Negative.   Psychiatric/Behavioral: Negative.     Per HPI unless specifically indicated above     Objective:    There were no vitals taken for this visit.  Wt Readings from Last 3 Encounters:  06/30/21 140 lb 12.8 oz (63.9 kg)  06/24/20 141 lb 9.6 oz (64.2 kg)  05/07/20 138 lb (62.6 kg)  Physical Exam Vitals and nursing note reviewed.  Constitutional:      General: She is awake. She is not in acute distress.    Appearance: She is well-developed and well-groomed. She is not ill-appearing or toxic-appearing.  HENT:     Head: Normocephalic.     Right Ear: Hearing normal.     Left Ear: Hearing normal.  Eyes:     General: Lids are  normal.        Right eye: No discharge.        Left eye: No discharge.     Conjunctiva/sclera: Conjunctivae normal.  Pulmonary:     Effort: Pulmonary effort is normal. No accessory muscle usage or respiratory distress.  Musculoskeletal:     Cervical back: Normal range of motion.  Neurological:     Mental Status: She is alert and oriented to person, place, and time.  Psychiatric:        Attention and Perception: Attention normal.        Mood and Affect: Mood normal.        Behavior: Behavior normal. Behavior is cooperative.        Thought Content: Thought content normal.        Judgment: Judgment normal.    Results for orders placed or performed in visit on 06/30/21  Microscopic Examination   Urine  Result Value Ref Range   WBC, UA 0-5 0 - 5 /hpf   RBC, Urine None seen 0 - 2 /hpf   Epithelial Cells (non renal) None seen 0 - 10 /hpf   Mucus, UA Present (A) Not Estab.   Bacteria, UA Few (A) None seen/Few  CBC with Differential/Platelet  Result Value Ref Range   WBC 6.7 3.4 - 10.8 x10E3/uL   RBC 4.50 3.77 - 5.28 x10E6/uL   Hemoglobin 13.8 11.1 - 15.9 g/dL   Hematocrit 41.7 34.0 - 46.6 %   MCV 93 79 - 97 fL   MCH 30.7 26.6 - 33.0 pg   MCHC 33.1 31.5 - 35.7 g/dL   RDW 13.3 11.7 - 15.4 %   Platelets 151 150 - 450 x10E3/uL   Neutrophils 66 Not Estab. %   Lymphs 25 Not Estab. %   Monocytes 7 Not Estab. %   Eos 1 Not Estab. %   Basos 1 Not Estab. %   Neutrophils Absolute 4.5 1.4 - 7.0 x10E3/uL   Lymphocytes Absolute 1.7 0.7 - 3.1 x10E3/uL   Monocytes Absolute 0.5 0.1 - 0.9 x10E3/uL   EOS (ABSOLUTE) 0.1 0.0 - 0.4 x10E3/uL   Basophils Absolute 0.0 0.0 - 0.2 x10E3/uL   Immature Granulocytes 0 Not Estab. %   Immature Grans (Abs) 0.0 0.0 - 0.1 x10E3/uL  Comprehensive metabolic panel  Result Value Ref Range   Glucose 79 70 - 99 mg/dL   BUN 11 8 - 27 mg/dL   Creatinine, Ser 0.78 0.57 - 1.00 mg/dL   eGFR 82 >59 mL/min/1.73   BUN/Creatinine Ratio 14 12 - 28   Sodium 142 134 -  144 mmol/L   Potassium 4.1 3.5 - 5.2 mmol/L   Chloride 102 96 - 106 mmol/L   CO2 26 20 - 29 mmol/L   Calcium 9.4 8.7 - 10.3 mg/dL   Total Protein 6.6 6.0 - 8.5 g/dL   Albumin 4.3 3.8 - 4.8 g/dL   Globulin, Total 2.3 1.5 - 4.5 g/dL   Albumin/Globulin Ratio 1.9 1.2 - 2.2   Bilirubin Total 0.3 0.0 - 1.2 mg/dL   Alkaline Phosphatase 56 44 -  121 IU/L   AST 24 0 - 40 IU/L   ALT 17 0 - 32 IU/L  Lipid Panel w/o Chol/HDL Ratio  Result Value Ref Range   Cholesterol, Total 261 (H) 100 - 199 mg/dL   Triglycerides 86 0 - 149 mg/dL   HDL 81 >39 mg/dL   VLDL Cholesterol Cal 14 5 - 40 mg/dL   LDL Chol Calc (NIH) 166 (H) 0 - 99 mg/dL  Urinalysis, Routine w reflex microscopic  Result Value Ref Range   Specific Gravity, UA 1.025 1.005 - 1.030   pH, UA 6.5 5.0 - 7.5   Color, UA Yellow Yellow   Appearance Ur Clear Clear   Leukocytes,UA 1+ (A) Negative   Protein,UA Negative Negative/Trace   Glucose, UA Negative Negative   Ketones, UA Negative Negative   RBC, UA Negative Negative   Bilirubin, UA Negative Negative   Urobilinogen, Ur 0.2 0.2 - 1.0 mg/dL   Nitrite, UA Negative Negative   Microscopic Examination See below:   TSH  Result Value Ref Range   TSH 1.160 0.450 - 4.500 uIU/mL      Assessment & Plan:   Problem List Items Addressed This Visit       Other   COVID-19 virus RNA test result positive at limit of detection - Primary    Tested positive x 2 at home with symptoms starting on 01/10/22.  Past date for Covid oral treatment, discussed with patient.  Overall she is improving with self care at home.  Discussed with her, will send in [place] to take if needed - if ear begins to hurt again or ongoing cough.  She does not want Prednisone.  Recommend: - Increased rest - Increasing Fluids - Acetaminophen as needed for fever/pain.  - Salt water gargling, chloraseptic spray and throat lozenges - Mucinex.  - Humidifying the air.  Return if worsening or ongoing.       I discussed the  assessment and treatment plan with the patient. The patient was provided an opportunity to ask questions and all were answered. The patient agreed with the plan and demonstrated an understanding of the instructions.   The patient was advised to call back or seek an in-person evaluation if the symptoms worsen or if the condition fails to improve as anticipated.   I provided 21+ minutes of time during this encounter.    Follow up plan: Return if symptoms worsen or fail to improve.

## 2022-01-18 NOTE — Patient Instructions (Signed)

## 2022-01-18 NOTE — Telephone Encounter (Signed)
  Chief Complaint: COVID positive  Symptoms: cough, congestion, SOB at times Frequency: 1 week  Pertinent Negatives: NA Disposition: []ED /[]Urgent Care (no appt availability in office) / [x]Appointment(In office/virtual)/ [] [facility]/ []Home Care/ []Refused Recommended Disposition /[][facility]/ [] Follow-up with PCP Additional Notes: pt states sx started on 01/10/22, tested positive on 01/12/22. Pt has been taking mucus relief products with little to no relief. Does feel slightly better today but wanting to get rid of mucus and may have possible ear infection. Scheduled VV for today at 1540 with Henrine Screws, NP. Care advice given and pt verbalized understanding.   Reason for Disposition  [1] COVID-19 diagnosed by positive lab test (e.g., PCR, rapid self-test kit) AND [2] mild symptoms (e.g., cough, fever, others) AND [7] no complications or SOB  Answer Assessment - Initial Assessment Questions 1. COVID-19 DIAGNOSIS: "How do you know that you have COVID?" (e.g., positive lab test or self-test, diagnosed by doctor or NP/PA, symptoms after exposure).     01/12/22 3. ONSET: "When did the COVID-19 symptoms start?"      01/10/22 5. COUGH: "Do you have a cough?" If Yes, ask: "How bad is the cough?"       yes 6. FEVER: "Do you have a fever?" If Yes, ask: "What is your temperature, how was it measured, and when did it start?"     no 7. RESPIRATORY STATUS: "Describe your breathing?" (e.g., normal; shortness of breath, wheezing, unable to speak)      At times  9. OTHER SYMPTOMS: "Do you have any other symptoms?"  (e.g., chills, fatigue, headache, loss of smell or taste, muscle pain, sore throat)     Stuffy head, cough, congestion  10. HIGH RISK DISEASE: "Do you have any chronic medical problems?" (e.g., asthma, heart or lung disease, weak immune system, obesity, etc.)       no 11. VACCINE: "Have you had the COVID-19 vaccine?" If Yes, ask: "Which one, how many shots, when did  you get it?"       yes  Protocols used: Coronavirus (COVID-19) Diagnosed or Suspected-A-AH

## 2022-01-18 NOTE — Assessment & Plan Note (Signed)
Tested positive x 2 at home with symptoms starting on 01/10/22.  Past date for Covid oral treatment, discussed with patient.  Overall she is improving with self care at home.  Discussed with her, will send in [place] to take if needed - if ear begins to hurt again or ongoing cough.  She does not want Prednisone.  Recommend: - Increased rest - Increasing Fluids - Acetaminophen as needed for fever/pain.  - Salt water gargling, chloraseptic spray and throat lozenges - Mucinex.  - Humidifying the air.  Return if worsening or ongoing.

## 2022-01-18 NOTE — Telephone Encounter (Signed)
Summary: COVID positive cough, mucus, headache/difficulty breathing when dealing with mucus.   Pt stated has been dealing with COVID for a week and would like to ask if there is anything that can be prescribed. Pt mentioned has cough, mucus, headache. When asked about wheezing pt stated not really some difficulty breathing when dealing with mucus.  Call was going in and out advised pt I would call her back called pt 5 times was sent to voicemail.  Pt seeking clinical advice        Attempted to call patient- left message to call office

## 2022-01-18 NOTE — Telephone Encounter (Signed)
Summary: COVID positive cough, mucus, headache/difficulty breathing when dealing with mucus.   Pt stated has been dealing with COVID for a week and would like to ask if there is anything that can be prescribed. Pt mentioned has cough, mucus, headache. When asked about wheezing pt stated not really some difficulty breathing when dealing with mucus.  Call was going in and out advised pt I would call her back called pt 5 times was sent to voicemail.  Pt seeking clinical advice.        Called patient to review sx of covid. No answer, LVMTCB [phone]

## 2022-04-26 ENCOUNTER — Telehealth: Payer: Self-pay | Admitting: Family Medicine

## 2022-04-26 NOTE — Telephone Encounter (Signed)
Copied from [place] [id number]. Topic: Medicare AWV >> Apr 26, 2022 10:17 AM Devoria Glassing wrote: Reason for CRM: Called patient to schedule Medicare Annual Wellness Visit (AWV). Left message for patient to call back and schedule Medicare Annual Wellness Visit (AWV).  Last date of AWV: 05/12/2021   Please schedule an appointment at any time with Kirke Shaggy, [facility]    If any questions, please contact me.  Thank you ,  Sherol Dade; [place] Direct Dial: [phone]

## 2022-06-15 ENCOUNTER — Telehealth: Payer: Self-pay | Admitting: Family Medicine

## 2022-06-15 NOTE — Telephone Encounter (Signed)
Contacted Heather Walsh to schedule their annual wellness visit. Patient declined to schedule AWV at this time. Patient not avail on Mon and Tues, will do AWV with provider per patient  Verlee Rossetti; Care Guide Ambulatory Clinical Support [place] [facility] Direct Dial: [phone]

## 2022-06-16 ENCOUNTER — Other Ambulatory Visit: Payer: Self-pay | Admitting: Family Medicine

## 2022-06-16 DIAGNOSIS — Z1231 Encounter for screening mammogram for malignant neoplasm of breast: Secondary | ICD-10-CM

## 2022-07-04 ENCOUNTER — Telehealth: Payer: Self-pay | Admitting: Family Medicine

## 2022-07-04 NOTE — Telephone Encounter (Signed)
Copied from CRM [id number]. Topic: Medicare AWV >> Jul 04, 2022  9:50 AM Payton Doughty wrote: Reason for CRM: Called patient to schedule Medicare Annual Wellness Visit (AWV). Left message for patient to call back and schedule Medicare Annual Wellness Visit (AWV).  Last date of AWV 05/12/2021  Please schedule an appointment at any time with Kennedy Bucker, LPN  .  If any questions, please contact me.  Thank you ,  Verlee Rossetti; Care Guide Ambulatory Clinical Support [place] [facility] Direct Dial: [phone]

## 2022-07-06 ENCOUNTER — Ambulatory Visit
Admission: RE | Admit: 2022-07-06 | Discharge: 2022-07-06 | Disposition: A | Discharge status: Home / Self Care | Payer: Medicare HMO | Source: Ambulatory Visit | Attending: Family Medicine | Admitting: Family Medicine

## 2022-07-06 ENCOUNTER — Encounter: Payer: Self-pay | Admitting: Family Medicine

## 2022-07-06 ENCOUNTER — Ambulatory Visit
Admission: RE | Admit: 2022-07-06 | Discharge: 2022-07-06 | Disposition: A | Discharge status: Home / Self Care | Payer: Medicare HMO | Attending: Family Medicine | Admitting: Family Medicine

## 2022-07-06 ENCOUNTER — Ambulatory Visit (INDEPENDENT_AMBULATORY_CARE_PROVIDER_SITE_OTHER): Payer: Medicare HMO | Admitting: Family Medicine

## 2022-07-06 VITALS — BP 103/66 | HR 69 | Temp 97.7°F | Ht 60.0 in | Wt 143.1 lb

## 2022-07-06 DIAGNOSIS — Z87891 Personal history of nicotine dependence: Secondary | ICD-10-CM

## 2022-07-06 DIAGNOSIS — R0789 Other chest pain: Secondary | ICD-10-CM | POA: Insufficient documentation

## 2022-07-06 DIAGNOSIS — Z0000 Encounter for general adult medical examination without abnormal findings: Secondary | ICD-10-CM | POA: Diagnosis not present

## 2022-07-06 DIAGNOSIS — D692 Other nonthrombocytopenic purpura: Secondary | ICD-10-CM | POA: Diagnosis not present

## 2022-07-06 DIAGNOSIS — R102 Pelvic and perineal pain: Secondary | ICD-10-CM

## 2022-07-06 DIAGNOSIS — E782 Mixed hyperlipidemia: Secondary | ICD-10-CM | POA: Diagnosis not present

## 2022-07-06 DIAGNOSIS — R918 Other nonspecific abnormal finding of lung field: Secondary | ICD-10-CM | POA: Diagnosis not present

## 2022-07-06 LAB — URINALYSIS, ROUTINE W REFLEX MICROSCOPIC
Bilirubin, UA: NEGATIVE
Glucose, UA: NEGATIVE
Ketones, UA: NEGATIVE
Leukocytes,UA: NEGATIVE
Nitrite, UA: NEGATIVE
Protein,UA: NEGATIVE
Specific Gravity, UA: 1.01 (ref 1.005–1.030)
Urobilinogen, Ur: 0.2 mg/dL (ref 0.2–1.0)
pH, UA: 5.5 (ref 5.0–7.5)

## 2022-07-06 LAB — MICROSCOPIC EXAMINATION
Bacteria, UA: NONE SEEN
WBC, UA: NONE SEEN /hpf (ref 0–5)

## 2022-07-06 NOTE — Patient Instructions (Signed)
Preventative Services:  Health Risk Assessment and Personalized Prevention Plan: Done today Bone Mass Measurements: Up to date Breast Cancer Screening: Scheduled for next week CVD Screening: Up to date Cervical Cancer Screening: N/A Colon Cancer Screening: up to date Depression Screening: Done today Diabetes Screening: Done today Glaucoma Screening: See your eye doctor Hepatitis B vaccine: N/A Hepatitis C screening: up to date HIV Screening: Up to date Flu Vaccine: up to date Lung cancer Screening: N/A Obesity Screening: Done today Pneumonia Vaccines (2): Up to date STI Screening: N/A

## 2022-07-06 NOTE — Assessment & Plan Note (Signed)
Rechecking labs today. Await results. Treat as needed.  °

## 2022-07-06 NOTE — Assessment & Plan Note (Signed)
Reassured patient. Continue to monitor. Call with any concerns.  

## 2022-07-06 NOTE — Progress Notes (Signed)
BP 103/66   Pulse 69   Temp 97.7 F (36.5 C) (Oral)   Ht 5' (1.524 m)   Wt 143 lb 1.6 oz (64.9 kg)   SpO2 94%   BMI 27.95 kg/m    Subjective:    Patient ID: Heather Walsh, female    DOB: 06-07-1950, 72 y.o.   MRN: [id number]  HPI: Heather Walsh is a 72 y.o. female presenting on 07/06/2022 for comprehensive medical examination. Current medical complaints include:  Has been having brain zaps. Thinks that it may be due to her allergies. Has been having fullness and tenderness in her lower abdomen/pelvis. No bleeding. She has also have pain in her L ribs.   HYPERLIPIDEMIA Hyperlipidemia status: N/A Satisfied with current treatment?  yes Side effects:  no Medication compliance: excellent compliance Past cholesterol meds: none Supplements: red yeast rice Aspirin:  no The 10-year ASCVD risk score (Arnett DK, et al., 2019) is: 7.1%   Values used to calculate the score:     Age: 32 years     Sex: Female     Is Non-Hispanic African American: No     Diabetic: No     Tobacco smoker: No     Systolic Blood Pressure: 103 mmHg     Is BP treated: No     HDL Cholesterol: 81 mg/dL     Total Cholesterol: 261 mg/dL Chest pain:  no  Menopausal Symptoms: no  Functional Status Survey: Is the patient deaf or have difficulty hearing?: No Does the patient have difficulty seeing, even when wearing glasses/contacts?: No Does the patient have difficulty concentrating, remembering, or making decisions?: No Does the patient have difficulty walking or climbing stairs?: No Does the patient have difficulty dressing or bathing?: No Does the patient have difficulty doing errands alone such as visiting a doctor's office or shopping?: No     07/06/2022   10:14 AM 01/18/2022    3:55 PM 06/30/2021   10:10 AM 05/12/2021    9:51 AM 06/24/2020    9:19 AM  Fall Risk   Falls in the past year? 0 0 0 0 0  Number falls in past yr: 0 0 0 0 0  Injury with Fall? 0 0 0 0 0  Risk for fall due to : No Fall  Risks No Fall Risks No Fall Risks  No Fall Risks  Follow up Falls evaluation completed Falls evaluation completed Falls evaluation completed Falls evaluation completed;Education provided;Falls prevention discussed Falls evaluation completed    Depression Screen    07/06/2022   10:14 AM 01/18/2022    3:55 PM 06/30/2021   10:10 AM 05/12/2021    9:55 AM 06/24/2020    9:19 AM  Depression screen PHQ 2/9  Decreased Interest 0 0 0 0 0  Down, Depressed, Hopeless 0 0 0 0 0  PHQ - 2 Score 0 0 0 0 0  Altered sleeping 0 0 0    Tired, decreased energy 2 0 1    Change in appetite 0 0 1    Feeling bad or failure about yourself  0 0 1    Trouble concentrating 0 0 1    Moving slowly or fidgety/restless 0 0 0    Suicidal thoughts 0 0 0    PHQ-9 Score 2 0 4    Difficult doing work/chores Not difficult at all Not difficult at all       Advanced Directives Does patient have a HCPOA?    no Does patient have a living  will or MOST form?  no  Past Medical History:  Past Medical History:  Diagnosis Date   Hyperlipidemia     Surgical History:  Past Surgical History:  Procedure Laterality Date   CESAREAN SECTION     X 2   COLONOSCOPY  01/01/2015   DIAPHRAGMATIC HERNIA REPAIR  11/2010   RIB FRACTURE SURGERY Left 07/2010   repair with fixation    Medications:  Current Outpatient Medications on File Prior to Visit  Medication Sig   cholecalciferol (VITAMIN D3) 25 MCG (1000 UNIT) tablet Take 1,000 Units by mouth daily.   Probiotic Product (PRO-BIOTIC BLEND PO) Take by mouth.   Red Yeast Rice 600 MG CAPS Take 2 capsules by mouth daily.   No current facility-administered medications on file prior to visit.    Allergies:  Allergies  Allergen Reactions   Sulfa Antibiotics Swelling    Social History:  Social History   Socioeconomic History   Marital status: Divorced    Spouse name: Not on file   Number of children: 2   Years of education: Not on file   Highest education level: Not on  file  Occupational History   Not on file  Tobacco Use   Smoking status: Former    Packs/day: 1.00    Years: 20.00    Additional pack years: 0.00    Total pack years: 20.00    Types: Cigarettes    Quit date: 06/02/1994    Years since quitting: 28.1   Smokeless tobacco: Never  Vaping Use   Vaping Use: Never used  Substance and Sexual Activity   Alcohol use: No   Drug use: Yes    Types: Marijuana   Sexual activity: Not Currently  Other Topics Concern   Not on file  Social History Narrative   Not on file   Social Determinants of Health   Financial Resource Strain: Low Risk  (05/12/2021)   Overall Financial Resource Strain (CARDIA)    Difficulty of Paying Living Expenses: Not hard at all  Food Insecurity: No Food Insecurity (05/12/2021)   Hunger Vital Sign    Worried About Running Out of Food in the Last Year: Never true    Ran Out of Food in the Last Year: Never true  Transportation Needs: No Transportation Needs (05/12/2021)   PRAPARE - Administrator, Civil Service (Medical): No    Lack of Transportation (Non-Medical): No  Physical Activity: Inactive (05/12/2021)   Exercise Vital Sign    Days of Exercise per Week: 0 days    Minutes of Exercise per Session: 0 min  Stress: No Stress Concern Present (05/12/2021)   [employer] of Occupational Health - Occupational Stress Questionnaire    Feeling of Stress : Not at all  Social Connections: Socially Isolated (05/12/2021)   Social Connection and Isolation Panel [NHANES]    Frequency of Communication with Friends and Family: Once a week    Frequency of Social Gatherings with Friends and Family: Once a week    Attends Religious Services: Never    Database administrator or Organizations: No    Attends Banker Meetings: Never    Marital Status: Divorced  Catering manager Violence: Not At Risk (05/12/2021)   Humiliation, Afraid, Rape, and Kick questionnaire    Fear of Current or Ex-Partner: No     Emotionally Abused: No    Physically Abused: No    Sexually Abused: No   Social History   Tobacco Use  Smoking  Status Former   Packs/day: 1.00   Years: 20.00   Additional pack years: 0.00   Total pack years: 20.00   Types: Cigarettes   Quit date: 06/02/1994   Years since quitting: 28.1  Smokeless Tobacco Never   Social History   Substance and Sexual Activity  Alcohol Use No    Family History:  Family History  Problem Relation Age of Onset   CAD Mother    Cancer Mother        lung   Heart attack Father    Heart disease Father    Alcohol abuse Brother    Cancer Brother        colon   Cancer Son        bone   Stroke Maternal Grandfather    CAD Paternal Grandmother    CAD Paternal Grandfather    Cancer Sister        Throat   Obesity Brother    Alcohol abuse Son    Breast cancer Neg Hx     Past medical history, surgical history, medications, allergies, family history and social history reviewed with patient today and changes made to appropriate areas of the chart.   Review of Systems  Constitutional: Negative.   HENT: Negative.    Eyes: Negative.   Respiratory: Negative.    Cardiovascular:  Positive for leg swelling. Negative for chest pain, palpitations, orthopnea, claudication and PND.  Gastrointestinal:  Positive for abdominal pain. Negative for blood in stool, constipation, diarrhea, heartburn, melena, nausea and vomiting.  Genitourinary: Negative.   Musculoskeletal:  Positive for myalgias. Negative for back pain, falls, joint pain and neck pain.  Skin: Negative.   Neurological: Negative.   Endo/Heme/Allergies:  Positive for environmental allergies. Negative for polydipsia. Bruises/bleeds easily.  Psychiatric/Behavioral: Negative.      All other ROS negative except what is listed above and in the HPI.      Objective:    BP 103/66   Pulse 69   Temp 97.7 F (36.5 C) (Oral)   Ht 5' (1.524 m)   Wt 143 lb 1.6 oz (64.9 kg)   SpO2 94%   BMI 27.95  kg/m   Wt Readings from Last 3 Encounters:  07/06/22 143 lb 1.6 oz (64.9 kg)  06/30/21 140 lb 12.8 oz (63.9 kg)  06/24/20 141 lb 9.6 oz (64.2 kg)    Physical Exam Vitals and nursing note reviewed.  Constitutional:      General: She is not in acute distress.    Appearance: Normal appearance. She is not ill-appearing, toxic-appearing or diaphoretic.  HENT:     Head: Normocephalic and atraumatic.     Right Ear: Tympanic membrane, ear canal and external ear normal. There is no impacted cerumen.     Left Ear: Tympanic membrane, ear canal and external ear normal. There is no impacted cerumen.     Nose: Nose normal. No congestion or rhinorrhea.     Mouth/Throat:     Mouth: Mucous membranes are moist.     Pharynx: Oropharynx is clear. No oropharyngeal exudate or posterior oropharyngeal erythema.  Eyes:     General: No scleral icterus.       Right eye: No discharge.        Left eye: No discharge.     Extraocular Movements: Extraocular movements intact.     Conjunctiva/sclera: Conjunctivae normal.     Pupils: Pupils are equal, round, and reactive to light.  Neck:     Vascular: No carotid bruit.  Cardiovascular:  Rate and Rhythm: Normal rate and regular rhythm.     Pulses: Normal pulses.     Heart sounds: No murmur heard.    No friction rub. No gallop.  Pulmonary:     Effort: Pulmonary effort is normal. No respiratory distress.     Breath sounds: Normal breath sounds. No stridor. No wheezing, rhonchi or rales.  Chest:     Chest wall: No tenderness.  Abdominal:     General: Abdomen is flat. Bowel sounds are normal. There is no distension.     Palpations: Abdomen is soft. There is no mass.     Tenderness: There is no abdominal tenderness. There is no right CVA tenderness, left CVA tenderness, guarding or rebound.     Hernia: No hernia is present.  Genitourinary:    Comments: Breast and pelvic exams deferred with shared decision making Musculoskeletal:        General: No swelling,  tenderness, deformity or signs of injury.     Cervical back: Normal range of motion and neck supple. No rigidity. No muscular tenderness.     Right lower leg: No edema.     Left lower leg: No edema.  Lymphadenopathy:     Cervical: No cervical adenopathy.  Skin:    General: Skin is warm and dry.     Capillary Refill: Capillary refill takes less than 2 seconds.     Coloration: Skin is not jaundiced or pale.     Findings: No bruising, erythema, lesion or rash.  Neurological:     General: No focal deficit present.     Mental Status: She is alert and oriented to person, place, and time. Mental status is at baseline.     Cranial Nerves: No cranial nerve deficit.     Sensory: No sensory deficit.     Motor: No weakness.     Coordination: Coordination normal.     Gait: Gait normal.     Deep Tendon Reflexes: Reflexes normal.  Psychiatric:        Mood and Affect: Mood normal.        Behavior: Behavior normal.        Thought Content: Thought content normal.        Judgment: Judgment normal.        07/06/2022   10:42 AM 05/07/2020    2:31 PM 02/28/2018    2:10 PM 01/02/2017    8:24 AM  6CIT Screen  What Year? 0 points 0 points  0 points  What month? 0 points 0 points 0 points 0 points  What time? 0 points 0 points 0 points 0 points  Count back from 20 0 points 0 points 0 points 0 points  Months in reverse 0 points 0 points 0 points 0 points  Repeat phrase 0 points 0 points 0 points 4 points  Total Score 0 points 0 points  4 points    Results for orders placed or performed in visit on 06/30/21  Microscopic Examination   Urine  Result Value Ref Range   WBC, UA 0-5 0 - 5 /hpf   RBC, Urine None seen 0 - 2 /hpf   Epithelial Cells (non renal) None seen 0 - 10 /hpf   Mucus, UA Present (A) Not Estab.   Bacteria, UA Few (A) None seen/Few  CBC with Differential/Platelet  Result Value Ref Range   WBC 6.7 3.4 - 10.8 x10E3/uL   RBC 4.50 3.77 - 5.28 x10E6/uL   Hemoglobin 13.8 11.1 - 15.9  g/dL  Hematocrit 41.7 34.0 - 46.6 %   MCV 93 79 - 97 fL   MCH 30.7 26.6 - 33.0 pg   MCHC 33.1 31.5 - 35.7 g/dL   RDW 16.1 09.6 - 04.5 %   Platelets 151 150 - 450 x10E3/uL   Neutrophils 66 Not Estab. %   Lymphs 25 Not Estab. %   Monocytes 7 Not Estab. %   Eos 1 Not Estab. %   Basos 1 Not Estab. %   Neutrophils Absolute 4.5 1.4 - 7.0 x10E3/uL   Lymphocytes Absolute 1.7 0.7 - 3.1 x10E3/uL   Monocytes Absolute 0.5 0.1 - 0.9 x10E3/uL   EOS (ABSOLUTE) 0.1 0.0 - 0.4 x10E3/uL   Basophils Absolute 0.0 0.0 - 0.2 x10E3/uL   Immature Granulocytes 0 Not Estab. %   Immature Grans (Abs) 0.0 0.0 - 0.1 x10E3/uL  Comprehensive metabolic panel  Result Value Ref Range   Glucose 79 70 - 99 mg/dL   BUN 11 8 - 27 mg/dL   Creatinine, Ser 4.09 0.57 - 1.00 mg/dL   eGFR 82 >81 XB/JYN/8.29   BUN/Creatinine Ratio 14 12 - 28   Sodium 142 134 - 144 mmol/L   Potassium 4.1 3.5 - 5.2 mmol/L   Chloride 102 96 - 106 mmol/L   CO2 26 20 - 29 mmol/L   Calcium 9.4 8.7 - 10.3 mg/dL   Total Protein 6.6 6.0 - 8.5 g/dL   Albumin 4.3 3.8 - 4.8 g/dL   Globulin, Total 2.3 1.5 - 4.5 g/dL   Albumin/Globulin Ratio 1.9 1.2 - 2.2   Bilirubin Total 0.3 0.0 - 1.2 mg/dL   Alkaline Phosphatase 56 44 - 121 IU/L   AST 24 0 - 40 IU/L   ALT 17 0 - 32 IU/L  Lipid Panel w/o Chol/HDL Ratio  Result Value Ref Range   Cholesterol, Total 261 (H) 100 - 199 mg/dL   Triglycerides 86 0 - 149 mg/dL   HDL 81 >56 mg/dL   VLDL Cholesterol Cal 14 5 - 40 mg/dL   LDL Chol Calc (NIH) 213 (H) 0 - 99 mg/dL  Urinalysis, Routine w reflex microscopic  Result Value Ref Range   Specific Gravity, UA 1.025 1.005 - 1.030   pH, UA 6.5 5.0 - 7.5   Color, UA Yellow Yellow   Appearance Ur Clear Clear   Leukocytes,UA 1+ (A) Negative   Protein,UA Negative Negative/Trace   Glucose, UA Negative Negative   Ketones, UA Negative Negative   RBC, UA Negative Negative   Bilirubin, UA Negative Negative   Urobilinogen, Ur 0.2 0.2 - 1.0 mg/dL   Nitrite, UA  Negative Negative   Microscopic Examination See below:   TSH  Result Value Ref Range   TSH 1.160 0.450 - 4.500 uIU/mL      Assessment & Plan:   Problem List Items Addressed This Visit       Cardiovascular and Mediastinum   Senile purpura (HCC)    Reassured patient. Continue to monitor. Call with any concerns.       Relevant Orders   CBC with Differential/Platelet   TSH     Other   Hyperlipidemia    Rechecking labs today. Await results. Treat as needed.       Relevant Orders   Comprehensive metabolic panel   Lipid Panel w/o Chol/HDL Ratio   TSH   Other Visit Diagnoses     Encounter for Medicare annual wellness exam    -  Primary   Preventative care discussed today. Continue to monitor.  Call with any concerns.   Routine general medical examination at a health care facility       Vaccines up to date. Screening labs checked today. Mammo, DEXA and colonoscopy up to date. Continue diet and exercise. Call with any concerns.   Pelvic pain       Concern for ovarian issue. Will check pelvic ultrasound. Await results. Treat as needed.   Relevant Orders   US Pelvis Limited   Other chest pain       Will check CXR. Await results. Treat as needed.   Relevant Orders   DG Chest 2 View   TSH   History of tobacco abuse       Checking labs today. Await results. Treat as needed.   Relevant Orders   Urinalysis, Routine w reflex microscopic        Preventative Services:  Health Risk Assessment and Personalized Prevention Plan: Done today Bone Mass Measurements: Up to date Breast Cancer Screening: Scheduled for next week CVD Screening: Up to date Cervical Cancer Screening: N/A Colon Cancer Screening: up to date Depression Screening: Done today Diabetes Screening: Done today Glaucoma Screening: See your eye doctor Hepatitis B vaccine: N/A Hepatitis C screening: up to date HIV Screening: Up to date Flu Vaccine: up to date Lung cancer Screening: N/A Obesity Screening: Done  today Pneumonia Vaccines (2): Up to date STI Screening: N/A  Follow up plan: Return in about 1 year (around 07/06/2023) for physical.   LABORATORY TESTING:  - Pap smear: not applicable  IMMUNIZATIONS:   - Tdap: Tetanus vaccination status reviewed: last tetanus booster within 10 years. - Influenza: Up to date - Pneumovax: Up to date - Prevnar: Up to date - Zostavax vaccine: Given elsewhere  SCREENING: -Mammogram: Up to date  - Colonoscopy: Up to date  - Bone Density: Up to date   PATIENT COUNSELING:   Advised to take 1 mg of folate supplement per day if capable of pregnancy.   Sexuality: Discussed sexually transmitted diseases, partner selection, use of condoms, avoidance of unintended pregnancy  and contraceptive alternatives.   Advised to avoid cigarette smoking.  I discussed with the patient that most people either abstain from alcohol or drink within safe limits (<=14/week and <=4 drinks/occasion for males, <=7/weeks and <= 3 drinks/occasion for females) and that the risk for alcohol disorders and other health effects rises proportionally with the number of drinks per week and how often a drinker exceeds daily limits.  Discussed cessation/primary prevention of drug use and availability of treatment for abuse.   Diet: Encouraged to adjust caloric intake to maintain  or achieve ideal body weight, to reduce intake of dietary saturated fat and total fat, to limit sodium intake by avoiding high sodium foods and not adding table salt, and to maintain adequate dietary potassium and calcium preferably from fresh fruits, vegetables, and low-fat dairy products.    stressed the importance of regular exercise  Injury prevention: Discussed safety belts, safety helmets, smoke detector, smoking near bedding or upholstery.   Dental health: Discussed importance of regular tooth brushing, flossing, and dental visits.    NEXT PREVENTATIVE PHYSICAL DUE IN 1 YEAR. Return in about 1 year  (around 07/06/2023) for physical.

## 2022-07-07 LAB — COMPREHENSIVE METABOLIC PANEL
ALT: 16 IU/L (ref 0–32)
AST: 25 IU/L (ref 0–40)
Albumin/Globulin Ratio: 2 (ref 1.2–2.2)
Albumin: 4.5 g/dL (ref 3.8–4.8)
Alkaline Phosphatase: 60 IU/L (ref 44–121)
BUN/Creatinine Ratio: 14 (ref 12–28)
BUN: 12 mg/dL (ref 8–27)
Bilirubin Total: 0.3 mg/dL (ref 0.0–1.2)
CO2: 26 mmol/L (ref 20–29)
Calcium: 9.7 mg/dL (ref 8.7–10.3)
Chloride: 101 mmol/L (ref 96–106)
Creatinine, Ser: 0.84 mg/dL (ref 0.57–1.00)
Globulin, Total: 2.2 g/dL (ref 1.5–4.5)
Glucose: 82 mg/dL (ref 70–99)
Potassium: 4.4 mmol/L (ref 3.5–5.2)
Sodium: 140 mmol/L (ref 134–144)
Total Protein: 6.7 g/dL (ref 6.0–8.5)
eGFR: 74 mL/min/{1.73_m2} (ref >59)

## 2022-07-07 LAB — LIPID PANEL W/O CHOL/HDL RATIO
Cholesterol, Total: 276 mg/dL — ABNORMAL HIGH (ref 100–199)
HDL: 88 mg/dL (ref >39)
LDL Chol Calc (NIH): 176 mg/dL — ABNORMAL HIGH (ref 0–99)
Triglycerides: 77 mg/dL (ref 0–149)
VLDL Cholesterol Cal: 12 mg/dL (ref 5–40)

## 2022-07-07 LAB — CBC WITH DIFFERENTIAL/PLATELET
Basophils Absolute: 0 10*3/uL (ref 0.0–0.2)
Basos: 1 %
EOS (ABSOLUTE): 0.1 10*3/uL (ref 0.0–0.4)
Eos: 1 %
Hematocrit: 42.5 % (ref 34.0–46.6)
Hemoglobin: 14.3 g/dL (ref 11.1–15.9)
Immature Grans (Abs): 0 10*3/uL (ref 0.0–0.1)
Immature Granulocytes: 0 %
Lymphocytes Absolute: 1.9 10*3/uL (ref 0.7–3.1)
Lymphs: 28 %
MCH: 31 pg (ref 26.6–33.0)
MCHC: 33.6 g/dL (ref 31.5–35.7)
MCV: 92 fL (ref 79–97)
Monocytes Absolute: 0.6 10*3/uL (ref 0.1–0.9)
Monocytes: 9 %
Neutrophils Absolute: 4.1 10*3/uL (ref 1.4–7.0)
Neutrophils: 61 %
Platelets: 173 10*3/uL (ref 150–450)
RBC: 4.62 x10E6/uL (ref 3.77–5.28)
RDW: 13.5 % (ref 11.7–15.4)
WBC: 6.6 10*3/uL (ref 3.4–10.8)

## 2022-07-07 LAB — TSH: TSH: 0.962 u[IU]/mL (ref 0.450–4.500)

## 2022-07-10 ENCOUNTER — Other Ambulatory Visit: Payer: Self-pay | Admitting: Family Medicine

## 2022-07-10 DIAGNOSIS — R9389 Abnormal findings on diagnostic imaging of other specified body structures: Secondary | ICD-10-CM

## 2022-07-10 DIAGNOSIS — R0789 Other chest pain: Secondary | ICD-10-CM

## 2022-07-12 ENCOUNTER — Ambulatory Visit
Admission: RE | Admit: 2022-07-12 | Discharge: 2022-07-12 | Disposition: A | Discharge status: Home / Self Care | Payer: Medicare HMO | Source: Ambulatory Visit | Attending: Family Medicine | Admitting: Family Medicine

## 2022-07-12 DIAGNOSIS — Z1231 Encounter for screening mammogram for malignant neoplasm of breast: Secondary | ICD-10-CM | POA: Insufficient documentation

## 2022-07-13 ENCOUNTER — Ambulatory Visit
Admission: RE | Admit: 2022-07-13 | Discharge: 2022-07-13 | Disposition: A | Discharge status: Home / Self Care | Payer: Medicare HMO | Source: Ambulatory Visit | Attending: Family Medicine | Admitting: Family Medicine

## 2022-07-13 DIAGNOSIS — R102 Pelvic and perineal pain: Secondary | ICD-10-CM | POA: Diagnosis not present

## 2022-07-19 ENCOUNTER — Ambulatory Visit
Admission: RE | Admit: 2022-07-19 | Discharge: 2022-07-19 | Disposition: A | Discharge status: Home / Self Care | Payer: Medicare HMO | Source: Ambulatory Visit | Attending: Family Medicine | Admitting: Family Medicine

## 2022-07-19 DIAGNOSIS — J439 Emphysema, unspecified: Secondary | ICD-10-CM | POA: Diagnosis not present

## 2022-07-19 DIAGNOSIS — R0789 Other chest pain: Secondary | ICD-10-CM | POA: Diagnosis not present

## 2022-07-19 DIAGNOSIS — R9389 Abnormal findings on diagnostic imaging of other specified body structures: Secondary | ICD-10-CM | POA: Diagnosis not present

## 2022-10-12 ENCOUNTER — Other Ambulatory Visit: Payer: Self-pay | Admitting: Medical Genetics

## 2022-10-12 DIAGNOSIS — Z006 Encounter for examination for normal comparison and control in clinical research program: Secondary | ICD-10-CM

## 2023-06-22 ENCOUNTER — Other Ambulatory Visit: Payer: Self-pay | Admitting: Family Medicine

## 2023-06-22 DIAGNOSIS — Z1231 Encounter for screening mammogram for malignant neoplasm of breast: Secondary | ICD-10-CM

## 2023-07-12 ENCOUNTER — Ambulatory Visit: Payer: Self-pay | Admitting: Family Medicine

## 2023-07-12 ENCOUNTER — Encounter: Payer: Self-pay | Admitting: Family Medicine

## 2023-07-12 VITALS — BP 107/70 | HR 60 | Temp 97.8°F | Wt 143.0 lb

## 2023-07-12 DIAGNOSIS — E782 Mixed hyperlipidemia: Secondary | ICD-10-CM

## 2023-07-12 DIAGNOSIS — D692 Other nonthrombocytopenic purpura: Secondary | ICD-10-CM | POA: Diagnosis not present

## 2023-07-12 DIAGNOSIS — R42 Dizziness and giddiness: Secondary | ICD-10-CM

## 2023-07-12 DIAGNOSIS — Z0000 Encounter for general adult medical examination without abnormal findings: Secondary | ICD-10-CM

## 2023-07-12 DIAGNOSIS — M79604 Pain in right leg: Secondary | ICD-10-CM | POA: Diagnosis not present

## 2023-07-12 MED ORDER — ALBUTEROL SULFATE HFA 108 (90 BASE) MCG/ACT IN AERS: 2.0000 | INHALATION_SPRAY | Freq: Four times a day (QID) | RESPIRATORY_TRACT | 6 refills | Status: AC | PRN

## 2023-07-12 NOTE — Progress Notes (Signed)
 BP 107/70 (BP Location: Left Arm, Patient Position: Sitting, Cuff Size: Normal)   Pulse 60   Temp 97.8 F (36.6 C) (Oral)   Wt 143 lb (64.9 kg)   SpO2 94%   BMI 27.93 kg/m    Subjective:    Patient ID: Heather Walsh, female    DOB: Apr 05, 1950, 73 y.o.   MRN: [id number]  HPI: Heather Walsh is a 73 y.o. female presenting on 07/12/2023 for comprehensive medical examination. Current medical complaints include:  HYPERLIPIDEMIA Hyperlipidemia status: stable Satisfied with current treatment?  yes Side effects:  N/A Past cholesterol meds: none Supplements: red yeast rice Aspirin:  no The 10-year ASCVD risk score (Arnett DK, et al., 2019) is: 8.6%   Values used to calculate the score:     Age: 43 years     Sex: Female     Is Non-Hispanic African American: No     Diabetic: No     Tobacco smoker: No     Systolic Blood Pressure: 107 mmHg     Is BP treated: No     HDL Cholesterol: 88 mg/dL     Total Cholesterol: 276 mg/dL Chest pain:  no Coronary artery disease:  no  DIZZINESS Duration: chronic off and on Description of symptoms: zaps in the R side of her head associated with spins and dizziness Duration of episode: minutes Dizziness frequency: recurrent Provoking factors: unknown Triggered by rolling over in bed: no Triggered by bending over: no Aggravated by head movement: no Aggravated by exertion, coughing, loud noises: no Recent head injury: no Recent or current viral symptoms: no History of vasovagal episodes: no Nausea: yes Vomiting: no Tinnitus: yes Hearing loss: no Aural fullness: yes Headache: no Photophobia/phonophobia: no Unsteady gait: yes Postural instability: no Diplopia, dysarthria, dysphagia or weakness: no Related to exertion: no Pallor: no Diaphoresis: no Dyspnea: no Chest pain: no  Menopausal Symptoms: none  Functional Status Survey: Is the patient deaf or have difficulty hearing?: No Does the patient have difficulty seeing, even when  wearing glasses/contacts?: Yes (sometimes) Does the patient have difficulty concentrating, remembering, or making decisions?: No Does the patient have difficulty walking or climbing stairs?: No Does the patient have difficulty dressing or bathing?: No Does the patient have difficulty doing errands alone such as visiting a doctor's office or shopping?: No     07/12/2023    1:31 PM 07/12/2023    1:26 PM 07/06/2022   10:14 AM 01/18/2022    3:55 PM 06/30/2021   10:10 AM  Fall Risk   Falls in the past year?  0 0 0 0  Number falls in past yr: 0 0 0 0 0  Injury with Fall?   0 0 0  Risk for fall due to :   No Fall Risks No Fall Risks No Fall Risks  Follow up   Falls evaluation completed Falls evaluation completed Falls evaluation completed    Depression Screen    07/12/2023    1:30 PM 07/06/2022   10:14 AM 01/18/2022    3:55 PM 06/30/2021   10:10 AM 05/12/2021    9:55 AM  Depression screen PHQ 2/9  Decreased Interest 0 0 0 0 0  Down, Depressed, Hopeless 0 0 0 0 0  PHQ - 2 Score 0 0 0 0 0  Altered sleeping 0 0 0 0   Tired, decreased energy 2 2 0 1   Change in appetite 0 0 0 1   Feeling bad or failure about yourself  0 0  0 1   Trouble concentrating 0 0 0 1   Moving slowly or fidgety/restless 0 0 0 0   Suicidal thoughts 0 0 0 0   PHQ-9 Score 2 2 0 4   Difficult doing work/chores Somewhat difficult Not difficult at all Not difficult at all      Advanced Directives Does patient have a HCPOA?    yes If yes, name and contact information:  Does patient have a living will or MOST form?  yes  Past Medical History:  Past Medical History:  Diagnosis Date   Hyperlipidemia     Surgical History:  Past Surgical History:  Procedure Laterality Date   CESAREAN SECTION     X 2   COLONOSCOPY  01/01/2015   DIAPHRAGMATIC HERNIA REPAIR  11/2010   RIB FRACTURE SURGERY Left 07/2010   repair with fixation    Medications:  Current Outpatient Medications on File Prior to Visit  Medication Sig    cholecalciferol (VITAMIN D3) 25 MCG (1000 UNIT) tablet Take 1,000 Units by mouth daily.   EPINEPHrine (PRIMATENE MIST) 0.125 MG/ACT AERO Inhale into the lungs 2 (two) times daily as needed.   Probiotic Product (PRO-BIOTIC BLEND PO) Take by mouth.   Red Yeast Rice 600 MG CAPS Take 2 capsules by mouth daily.   No current facility-administered medications on file prior to visit.    Allergies:  Allergies  Allergen Reactions   Sulfa Antibiotics Swelling    Social History:  Social History   Socioeconomic History   Marital status: Divorced    Spouse name: Not on file   Number of children: 2   Years of education: Not on file   Highest education level: Not on file  Occupational History   Not on file  Tobacco Use   Smoking status: Former    Current packs/day: 0.00    Average packs/day: 1 pack/day for 20.0 years (20.0 ttl pk-yrs)    Types: Cigarettes    Start date: 06/02/1974    Quit date: 06/02/1994    Years since quitting: 29.1   Smokeless tobacco: Never  Vaping Use   Vaping status: Never Used  Substance and Sexual Activity   Alcohol use: No   Drug use: Yes    Types: Marijuana   Sexual activity: Not Currently  Other Topics Concern   Not on file  Social History Narrative   Not on file   Social Drivers of Health   Financial Resource Strain: Low Risk  (07/12/2023)   Overall Financial Resource Strain (CARDIA)    Difficulty of Paying Living Expenses: Not hard at all  Food Insecurity: No Food Insecurity (07/12/2023)   Hunger Vital Sign    Worried About Running Out of Food in the Last Year: Never true    Ran Out of Food in the Last Year: Never true  Transportation Needs: No Transportation Needs (07/12/2023)   PRAPARE - Administrator, Civil Service (Medical): No    Lack of Transportation (Non-Medical): No  Physical Activity: Inactive (07/12/2023)   Exercise Vital Sign    Days of Exercise per Week: 0 days    Minutes of Exercise per Session: 0 min  Stress: No  Stress Concern Present (07/12/2023)   [employer] of Occupational Health - Occupational Stress Questionnaire    Feeling of Stress : Not at all  Social Connections: Socially Isolated (07/12/2023)   Social Connection and Isolation Panel [NHANES]    Frequency of Communication with Friends and Family: Once a week  Frequency of Social Gatherings with Friends and Family: Never    Attends Religious Services: Never    Database administrator or Organizations: No    Attends Banker Meetings: Never    Marital Status: Divorced  Catering manager Violence: Not At Risk (07/12/2023)   Humiliation, Afraid, Rape, and Kick questionnaire    Fear of Current or Ex-Partner: No    Emotionally Abused: No    Physically Abused: No    Sexually Abused: No   Social History   Tobacco Use  Smoking Status Former   Current packs/day: 0.00   Average packs/day: 1 pack/day for 20.0 years (20.0 ttl pk-yrs)   Types: Cigarettes   Start date: 06/02/1974   Quit date: 06/02/1994   Years since quitting: 29.1  Smokeless Tobacco Never   Social History   Substance and Sexual Activity  Alcohol Use No    Family History:  Family History  Problem Relation Age of Onset   CAD Mother    Cancer Mother        lung   Heart attack Father    Heart disease Father    Alcohol abuse Brother    Cancer Brother        colon   Cancer Son        bone   Stroke Maternal Grandfather    CAD Paternal Grandmother    CAD Paternal Grandfather    Cancer Sister        Throat   Obesity Brother    Alcohol abuse Son    Breast cancer Neg Hx     Past medical history, surgical history, medications, allergies, family history and social history reviewed with patient today and changes made to appropriate areas of the chart.   Review of Systems  Constitutional: Negative.   HENT:  Positive for ear pain (L ear). Negative for congestion, ear discharge, hearing loss, nosebleeds, sinus pain, sore throat and tinnitus.   Eyes:  Negative.   Respiratory:  Positive for cough. Negative for hemoptysis, sputum production, shortness of breath, wheezing and stridor.   Cardiovascular:  Positive for leg swelling. Negative for chest pain, palpitations, orthopnea, claudication and PND.  Gastrointestinal: Negative.   Genitourinary: Negative.   Musculoskeletal:  Positive for myalgias. Negative for back pain, falls, joint pain and neck pain.  Skin: Negative.   Neurological:  Positive for dizziness. Negative for tingling, tremors, sensory change, speech change, focal weakness, seizures, loss of consciousness, weakness and headaches.  Endo/Heme/Allergies:  Positive for environmental allergies. Negative for polydipsia. Does not bruise/bleed easily.  Psychiatric/Behavioral: Negative.      All other ROS negative except what is listed above and in the HPI.      Objective:    BP 107/70 (BP Location: Left Arm, Patient Position: Sitting, Cuff Size: Normal)   Pulse 60   Temp 97.8 F (36.6 C) (Oral)   Wt 143 lb (64.9 kg)   SpO2 94%   BMI 27.93 kg/m   Wt Readings from Last 3 Encounters:  07/12/23 143 lb (64.9 kg)  07/06/22 143 lb 1.6 oz (64.9 kg)  06/30/21 140 lb 12.8 oz (63.9 kg)     Physical Exam Vitals and nursing note reviewed.  Constitutional:      General: She is not in acute distress.    Appearance: Normal appearance. She is not ill-appearing, toxic-appearing or diaphoretic.  HENT:     Head: Normocephalic and atraumatic.     Right Ear: Tympanic membrane, ear canal and external ear normal. There  is no impacted cerumen.     Left Ear: Tympanic membrane, ear canal and external ear normal. There is no impacted cerumen.     Nose: Nose normal. No congestion or rhinorrhea.     Mouth/Throat:     Mouth: Mucous membranes are moist.     Pharynx: Oropharynx is clear. No oropharyngeal exudate or posterior oropharyngeal erythema.  Eyes:     General: No scleral icterus.       Right eye: No discharge.        Left eye: No  discharge.     Extraocular Movements: Extraocular movements intact.     Conjunctiva/sclera: Conjunctivae normal.     Pupils: Pupils are equal, round, and reactive to light.  Neck:     Vascular: No carotid bruit.  Cardiovascular:     Rate and Rhythm: Normal rate and regular rhythm.     Pulses: Normal pulses.     Heart sounds: No murmur heard.    No friction rub. No gallop.  Pulmonary:     Effort: Pulmonary effort is normal. No respiratory distress.     Breath sounds: Normal breath sounds. No stridor. No wheezing, rhonchi or rales.  Chest:     Chest wall: No tenderness.  Abdominal:     General: Abdomen is flat. Bowel sounds are normal. There is no distension.     Palpations: Abdomen is soft. There is no mass.     Tenderness: There is no abdominal tenderness. There is no right CVA tenderness, left CVA tenderness, guarding or rebound.     Hernia: No hernia is present.  Genitourinary:    Comments: Breast and pelvic exams deferred with shared decision making Musculoskeletal:        General: No swelling, tenderness, deformity or signs of injury.     Cervical back: Normal range of motion and neck supple. No rigidity. No muscular tenderness.     Right lower leg: No edema.     Left lower leg: No edema.  Lymphadenopathy:     Cervical: No cervical adenopathy.  Skin:    General: Skin is warm and dry.     Capillary Refill: Capillary refill takes less than 2 seconds.     Coloration: Skin is not jaundiced or pale.     Findings: No bruising, erythema, lesion or rash.  Neurological:     General: No focal deficit present.     Mental Status: She is alert and oriented to person, place, and time. Mental status is at baseline.     Cranial Nerves: No cranial nerve deficit.     Sensory: No sensory deficit.     Motor: No weakness.     Coordination: Coordination normal.     Gait: Gait normal.     Deep Tendon Reflexes: Reflexes normal.  Psychiatric:        Mood and Affect: Mood normal.         Behavior: Behavior normal.        Thought Content: Thought content normal.        Judgment: Judgment normal.        07/12/2023    1:28 PM 07/06/2022   10:42 AM 05/07/2020    2:31 PM 02/28/2018    2:10 PM 01/02/2017    8:24 AM  6CIT Screen  What Year? 0 points 0 points 0 points  0 points  What month? 0 points 0 points 0 points 0 points 0 points  What time? 0 points 0 points 0 points 0 points 0  points  Count back from 20 0 points 0 points 0 points 0 points 0 points  Months in reverse 0 points 0 points 0 points 0 points 0 points  Repeat phrase 0 points 0 points 0 points 0 points 4 points  Total Score 0 points 0 points 0 points  4 points   Results for orders placed or performed in visit on 07/06/22  Microscopic Examination   Collection Time: 07/06/22 10:43 AM   Urine  Result Value Ref Range   WBC, UA None seen 0 - 5 /hpf   RBC, Urine 0-2 0 - 2 /hpf   Epithelial Cells (non renal) 0-10 0 - 10 /hpf   Bacteria, UA None seen None seen/Few  Urinalysis, Routine w reflex microscopic   Collection Time: 07/06/22 10:43 AM  Result Value Ref Range   Specific Gravity, UA 1.010 1.005 - 1.030   pH, UA 5.5 5.0 - 7.5   Color, UA Yellow Yellow   Appearance Ur Clear Clear   Leukocytes,UA Negative Negative   Protein,UA Negative Negative/Trace   Glucose, UA Negative Negative   Ketones, UA Negative Negative   RBC, UA Trace (A) Negative   Bilirubin, UA Negative Negative   Urobilinogen, Ur 0.2 0.2 - 1.0 mg/dL   Nitrite, UA Negative Negative   Microscopic Examination See below:   CBC with Differential/Platelet   Collection Time: 07/06/22 10:46 AM  Result Value Ref Range   WBC 6.6 3.4 - 10.8 x10E3/uL   RBC 4.62 3.77 - 5.28 x10E6/uL   Hemoglobin 14.3 11.1 - 15.9 g/dL   Hematocrit 56.2 13.0 - 46.6 %   MCV 92 79 - 97 fL   MCH 31.0 26.6 - 33.0 pg   MCHC 33.6 31.5 - 35.7 g/dL   RDW 86.5 78.4 - 69.6 %   Platelets 173 150 - 450 x10E3/uL   Neutrophils 61 Not Estab. %   Lymphs 28 Not Estab. %    Monocytes 9 Not Estab. %   Eos 1 Not Estab. %   Basos 1 Not Estab. %   Neutrophils Absolute 4.1 1.4 - 7.0 x10E3/uL   Lymphocytes Absolute 1.9 0.7 - 3.1 x10E3/uL   Monocytes Absolute 0.6 0.1 - 0.9 x10E3/uL   EOS (ABSOLUTE) 0.1 0.0 - 0.4 x10E3/uL   Basophils Absolute 0.0 0.0 - 0.2 x10E3/uL   Immature Granulocytes 0 Not Estab. %   Immature Grans (Abs) 0.0 0.0 - 0.1 x10E3/uL  Comprehensive metabolic panel   Collection Time: 07/06/22 10:46 AM  Result Value Ref Range   Glucose 82 70 - 99 mg/dL   BUN 12 8 - 27 mg/dL   Creatinine, Ser 2.95 0.57 - 1.00 mg/dL   eGFR 74 >28 UX/LKG/4.01   BUN/Creatinine Ratio 14 12 - 28   Sodium 140 134 - 144 mmol/L   Potassium 4.4 3.5 - 5.2 mmol/L   Chloride 101 96 - 106 mmol/L   CO2 26 20 - 29 mmol/L   Calcium 9.7 8.7 - 10.3 mg/dL   Total Protein 6.7 6.0 - 8.5 g/dL   Albumin 4.5 3.8 - 4.8 g/dL   Globulin, Total 2.2 1.5 - 4.5 g/dL   Albumin/Globulin Ratio 2.0 1.2 - 2.2   Bilirubin Total 0.3 0.0 - 1.2 mg/dL   Alkaline Phosphatase 60 44 - 121 IU/L   AST 25 0 - 40 IU/L   ALT 16 0 - 32 IU/L  Lipid Panel w/o Chol/HDL Ratio   Collection Time: 07/06/22 10:46 AM  Result Value Ref Range   Cholesterol, Total 276 (  H) 100 - 199 mg/dL   Triglycerides 77 0 - 149 mg/dL   HDL 88 >60 mg/dL   VLDL Cholesterol Cal 12 5 - 40 mg/dL   LDL Chol Calc (NIH) 454 (H) 0 - 99 mg/dL  TSH   Collection Time: 07/06/22 10:46 AM  Result Value Ref Range   TSH 0.962 0.450 - 4.500 uIU/mL      Assessment & Plan:   Problem List Items Addressed This Visit       Cardiovascular and Mediastinum   Senile purpura (HCC)   Reassured patient. Continue to monitor.       Relevant Orders   CBC with Differential/Platelet   TSH     Other   Hyperlipidemia   Rechecking labs today. Await results. Treat as needed.       Relevant Orders   Comprehensive metabolic panel with GFR   Lipid Panel w/o Chol/HDL Ratio   TSH   Other Visit Diagnoses       Encounter for Medicare annual  wellness exam    -  Primary   Preventative care discussed today as below.     Routine general medical examination at a health care facility       Vaccines up to date/declined. Screening labs checked today. Mammo scheduled. DEXA and colonoscopy up to date. Continue diet and exercise. Call with concerns.     Dizziness       Chronic and not improving. Will check MRI w and wo to look for acoustic neuroma. Await results.   Relevant Orders   MR Brain W Wo Contrast     Right leg pain       History of tumor. Will check x-ray. Await results.   Relevant Orders   DG Foot Complete Right   DG Tibia/Fibula Right        Preventative Services:  Health Risk Assessment and Personalized Prevention Plan: Done today Bone Mass Measurements: Due in 2026 Breast Cancer Screening: scheduled for next week CVD Screening: done today Cervical Cancer Screening: N/A Colon Cancer Screening: up to date Depression Screening: done today Diabetes Screening: done today Glaucoma Screening: see your eye doctor Hepatitis B vaccine: N/A Hepatitis C screening: up to date HIV Screening: up to date Flu Vaccine: get in the fall Lung cancer Screening: N/A Obesity Screening: done today Pneumonia Vaccines (2): up to date STI Screening: N/A  Follow up plan: Return in about 1 year (around 07/11/2024) for physical/AWV.   LABORATORY TESTING:  - Pap smear: not applicable  IMMUNIZATIONS:   - Tdap: Tetanus vaccination status reviewed: last tetanus booster within 10 years. - Influenza: Postponed to flu season - Pneumovax: Up to date - Prevnar: Up to date - Zostavax vaccine: Refused  SCREENING: -Mammogram: Up to date  - Colonoscopy: Up to date  - Bone Density: Up to date   PATIENT COUNSELING:   Advised to take 1 mg of folate supplement per day if capable of pregnancy.   Sexuality: Discussed sexually transmitted diseases, partner selection, use of condoms, avoidance of unintended pregnancy  and contraceptive  alternatives.   Advised to avoid cigarette smoking.  I discussed with the patient that most people either abstain from alcohol or drink within safe limits (<=14/week and <=4 drinks/occasion for males, <=7/weeks and <= 3 drinks/occasion for females) and that the risk for alcohol disorders and other health effects rises proportionally with the number of drinks per week and how often a drinker exceeds daily limits.  Discussed cessation/primary prevention of drug use and availability  of treatment for abuse.   Diet: Encouraged to adjust caloric intake to maintain  or achieve ideal body weight, to reduce intake of dietary saturated fat and total fat, to limit sodium intake by avoiding high sodium foods and not adding table salt, and to maintain adequate dietary potassium and calcium preferably from fresh fruits, vegetables, and low-fat dairy products.    stressed the importance of regular exercise  Injury prevention: Discussed safety belts, safety helmets, smoke detector, smoking near bedding or upholstery.   Dental health: Discussed importance of regular tooth brushing, flossing, and dental visits.    NEXT PREVENTATIVE PHYSICAL DUE IN 1 YEAR. Return in about 1 year (around 07/11/2024) for physical/AWV.

## 2023-07-12 NOTE — Assessment & Plan Note (Signed)
 Reassured patient. Continue to monitor.

## 2023-07-12 NOTE — Assessment & Plan Note (Signed)
 Rechecking labs today. Await results. Treat as needed.

## 2023-07-12 NOTE — Progress Notes (Signed)
 Subjective:   Heather Walsh is a 73 y.o. female who presents for Medicare Annual (Subsequent) preventive examination.  Visit Complete: In person  Patient Medicare AWV questionnaire was completed by the patient on 07/12/23; I have confirmed that all information answered by patient is correct and no changes since this date.  Cardiac Risk Factors include: advanced age (>25men, >69 women);sedentary lifestyle;family history of premature cardiovascular disease     Objective:    Today's Vitals   07/12/23 1334  BP: 107/70  Pulse: 60  Temp: 97.8 F (36.6 C)  TempSrc: Oral  SpO2: 94%  Weight: 143 lb (64.9 kg)  PainSc: 0-No pain   Body mass index is 27.93 kg/m.     05/12/2021    9:49 AM 05/07/2020    2:30 PM 02/28/2018    2:05 PM  Advanced Directives  Does Patient Have a Medical Advance Directive? No Yes Yes  Type of Special educational needs teacher of [place];Living will Healthcare Power of Ward;Living will  Does patient want to make changes to medical advance directive?   No - Patient declined  Copy of Healthcare Power of Attorney in Chart?  Yes - validated most recent copy scanned in chart (See row information) Yes - validated most recent copy scanned in chart (See row information)  Would patient like information on creating a medical advance directive? No - Patient declined      Current Medications (verified) Outpatient Encounter Medications as of 07/12/2023  Medication Sig   cholecalciferol (VITAMIN D3) 25 MCG (1000 UNIT) tablet Take 1,000 Units by mouth daily.   EPINEPHrine (PRIMATENE MIST) 0.125 MG/ACT AERO Inhale into the lungs 2 (two) times daily as needed.   Probiotic Product (PRO-BIOTIC BLEND PO) Take by mouth.   Red Yeast Rice 600 MG CAPS Take 2 capsules by mouth daily.   No facility-administered encounter medications on file as of 07/12/2023.    Allergies (verified) Sulfa antibiotics   History: Past Medical History:  Diagnosis Date   Hyperlipidemia     Past Surgical History:  Procedure Laterality Date   CESAREAN SECTION     X 2   COLONOSCOPY  01/01/2015   DIAPHRAGMATIC HERNIA REPAIR  11/2010   RIB FRACTURE SURGERY Left 07/2010   repair with fixation   Family History  Problem Relation Age of Onset   CAD Mother    Cancer Mother        lung   Heart attack Father    Heart disease Father    Alcohol abuse Brother    Cancer Brother        colon   Cancer Son        bone   Stroke Maternal Grandfather    CAD Paternal Grandmother    CAD Paternal Grandfather    Cancer Sister        Throat   Obesity Brother    Alcohol abuse Son    Breast cancer Neg Hx    Social History   Socioeconomic History   Marital status: Divorced    Spouse name: Not on file   Number of children: 2   Years of education: Not on file   Highest education level: Not on file  Occupational History   Not on file  Tobacco Use   Smoking status: Former    Current packs/day: 0.00    Average packs/day: 1 pack/day for 20.0 years (20.0 ttl pk-yrs)    Types: Cigarettes    Start date: 06/02/1974    Quit date: 06/02/1994  Years since quitting: 29.1   Smokeless tobacco: Never  Vaping Use   Vaping status: Never Used  Substance and Sexual Activity   Alcohol use: No   Drug use: Yes    Types: Marijuana   Sexual activity: Not Currently  Other Topics Concern   Not on file  Social History Narrative   Not on file   Social Drivers of Health   Financial Resource Strain: Low Risk  (07/12/2023)   Overall Financial Resource Strain (CARDIA)    Difficulty of Paying Living Expenses: Not hard at all  Food Insecurity: No Food Insecurity (07/12/2023)   Hunger Vital Sign    Worried About Running Out of Food in the Last Year: Never true    Ran Out of Food in the Last Year: Never true  Transportation Needs: No Transportation Needs (07/12/2023)   PRAPARE - Administrator, Civil Service (Medical): No    Lack of Transportation (Non-Medical): No  Physical  Activity: Inactive (07/12/2023)   Exercise Vital Sign    Days of Exercise per Week: 0 days    Minutes of Exercise per Session: 0 min  Stress: No Stress Concern Present (07/12/2023)   [employer] of Occupational Health - Occupational Stress Questionnaire    Feeling of Stress : Not at all  Social Connections: Socially Isolated (07/12/2023)   Social Connection and Isolation Panel [NHANES]    Frequency of Communication with Friends and Family: Once a week    Frequency of Social Gatherings with Friends and Family: Never    Attends Religious Services: Never    Database administrator or Organizations: No    Attends Engineer, structural: Never    Marital Status: Divorced    Tobacco Counseling Counseling given: Not Answered   Clinical Intake:     Pain : No/denies pain Pain Score: 0-No pain     Nutritional Risks: None Diabetes: No  How often do you need to have someone help you when you read instructions, pamphlets, or other written materials from your doctor or pharmacy?: 1 - Never  Interpreter Needed?: No      Activities of Daily Living    07/12/2023    1:26 PM  In your present state of health, do you have any difficulty performing the following activities:  Hearing? 0  Vision? 1  Comment sometimes  Difficulty concentrating or making decisions? 0  Walking or climbing stairs? 0  Dressing or bathing? 0  Doing errands, shopping? 0  Preparing Food and eating ? N  Using the Toilet? N  In the past six months, have you accidently leaked urine? N  Do you have problems with loss of bowel control? N  Managing your Medications? N  Managing your Finances? N  Housekeeping or managing your Housekeeping? N    Patient Care Team: Solomon Dupre, DO as PCP - General (Family Medicine)  Indicate any recent Medical Services you may have received from other than [facility] providers in the past year (date may be approximate).     Assessment:   This is a routine wellness  examination for Tracia.  Hearing/Vision screen No results found.   Goals Addressed   None   Depression Screen    07/12/2023    1:30 PM 07/06/2022   10:14 AM 01/18/2022    3:55 PM 06/30/2021   10:10 AM 05/12/2021    9:55 AM 06/24/2020    9:19 AM 05/07/2020    2:30 PM  PHQ 2/9 Scores  PHQ -  2 Score 0 0 0 0 0 0 0  PHQ- 9 Score 2 2 0 4       Fall Risk    07/12/2023    1:31 PM 07/12/2023    1:26 PM 07/06/2022   10:14 AM 01/18/2022    3:55 PM 06/30/2021   10:10 AM  Fall Risk   Falls in the past year?  0 0 0 0  Number falls in past yr: 0 0 0 0 0  Injury with Fall?   0 0 0  Risk for fall due to :   No Fall Risks No Fall Risks No Fall Risks  Follow up   Falls evaluation completed Falls evaluation completed Falls evaluation completed    MEDICARE RISK AT HOME: Medicare Risk at Home Any stairs in or around the home?: No If so, are there any without handrails?: No Home free of loose throw rugs in walkways, pet beds, electrical cords, etc?: Yes Adequate lighting in your home to reduce risk of falls?: Yes Life alert?: No Use of a cane, walker or w/c?: No Grab bars in the bathroom?: No Shower chair or bench in shower?: No Elevated toilet seat or a handicapped toilet?: No  TIMED UP AND GO:  Was the test performed?  Yes  Length of time to ambulate 10 feet: 8 sec Gait steady and fast without use of assistive device    Cognitive Function:        07/12/2023    1:28 PM 07/06/2022   10:42 AM 05/07/2020    2:31 PM 02/28/2018    2:10 PM 01/02/2017    8:24 AM  6CIT Screen  What Year? 0 points 0 points 0 points  0 points  What month? 0 points 0 points 0 points 0 points 0 points  What time? 0 points 0 points 0 points 0 points 0 points  Count back from 20 0 points 0 points 0 points 0 points 0 points  Months in reverse 0 points 0 points 0 points 0 points 0 points  Repeat phrase 0 points 0 points 0 points 0 points 4 points  Total Score 0 points 0 points 0 points  4 points     Immunizations Immunization History  Administered Date(s) Administered   Fluad Quad(high Dose 65+) 11/01/2018, 12/25/2019   Influenza, High Dose Seasonal PF 12/10/2017, 12/24/2020   PFIZER(Purple Top)SARS-COV-2 Vaccination 04/16/2019, 05/07/2019, 12/11/2019   Pneumococcal Conjugate-13 01/02/2017   Pneumococcal Polysaccharide-23 02/28/2018   Tdap 10/29/2014    TDAP status: Up to date  Flu Vaccine status: Up to date  Pneumococcal vaccine status: Up to date  Covid-19 vaccine status: Information provided on how to obtain vaccines.   Qualifies for Shingles Vaccine? Yes   Zostavax completed No     Screening Tests Health Maintenance  Topic Date Due   Zoster Vaccines- Shingrix (1 of 2) Never done   COVID-19 Vaccine (6 - 2024-25 season) 06/27/2023   INFLUENZA VACCINE  09/21/2023   DEXA SCAN  07/06/2024   MAMMOGRAM  07/11/2024   Medicare Annual Wellness (AWV)  07/11/2024   DTaP/Tdap/Td (2 - Td or Tdap) 10/28/2024   Colonoscopy  12/31/2024   Pneumonia Vaccine 50+ Years old  Completed   Hepatitis C Screening  Addressed   HPV VACCINES  Aged Out   Meningococcal B Vaccine  Aged Out    Health Maintenance  Health Maintenance Due  Topic Date Due   Zoster Vaccines- Shingrix (1 of 2) Never done   COVID-19 Vaccine (6 - 2024-25  season) 06/27/2023    Colorectal cancer screening: Type of screening: Colonoscopy. Completed 01/01/15. Repeat every 10 years  Mammogram status: Completed 07/12/22. Repeat every year  Bone Density status: Completed 07/06/21. Results reflect: Bone density results: OSTEOPOROSIS. Repeat every 3 years.  Lung Cancer Screening: (Low Dose CT Chest recommended if Age 82-80 years, 20 pack-year currently smoking OR have quit w/in 15years.) does not qualify.    Additional Screening:  Hepatitis C Screening: does qualify; Completed 10/29/14  Dental Screening: Recommended annual dental exams for proper oral hygiene   Community Resource Referral / Chronic Care  Management: CRR required this visit?  No   CCM required this visit?  No     Plan:     I have personally reviewed and noted the following in the patient's chart:   Medical and social history Use of alcohol, tobacco or illicit drugs  Current medications and supplements including opioid prescriptions. Patient is not currently taking opioid prescriptions. Functional ability and status Nutritional status Physical activity Advanced directives List of other physicians Hospitalizations, surgeries, and ER visits in previous 12 months Vitals Screenings to include cognitive, depression, and falls Referrals and appointments  In addition, I have reviewed and discussed with patient certain preventive protocols, quality metrics, and best practice recommendations. A written personalized care plan for preventive services as well as general preventive health recommendations were provided to patient.     Terre Ferri, DO   07/12/2023   After Visit Summary: (In Person-Printed) AVS printed and given to the patient

## 2023-07-12 NOTE — Patient Instructions (Addendum)
  Heather Walsh , Thank you for taking time to come for your Medicare Wellness Visit. I appreciate your ongoing commitment to your health goals. Please review the following plan we discussed and let me know if I can assist you in the future.    This is a list of the screening recommended for you and due dates:  Health Maintenance  Topic Date Due   Zoster (Shingles) Vaccine (1 of 2) Never done   COVID-19 Vaccine (6 - 2024-25 season) 06/27/2023   Flu Shot  09/21/2023   DEXA scan (bone density measurement)  07/06/2024   Mammogram  07/11/2024   Medicare Annual Wellness Visit  07/11/2024   DTaP/Tdap/Td vaccine (2 - Td or Tdap) 10/28/2024   Colon Cancer Screening  12/31/2024   Pneumonia Vaccine  Completed   Hepatitis C Screening  Addressed   HPV Vaccine  Aged Out   Meningitis B Vaccine  Aged Out   Preventative Services:  Health Risk Assessment and Personalized Prevention Plan: Done today Bone Mass Measurements: Due in 2026 Breast Cancer Screening: scheduled for next week CVD Screening: done today Cervical Cancer Screening: N/A Colon Cancer Screening: up to date Depression Screening: done today Diabetes Screening: done today Glaucoma Screening: see your eye doctor Hepatitis B vaccine: N/A Hepatitis C screening: up to date HIV Screening: up to date Flu Vaccine: get in the fall Lung cancer Screening: N/A Obesity Screening: done today Pneumonia Vaccines (2): up to date STI Screening: N/A

## 2023-07-13 ENCOUNTER — Ambulatory Visit: Payer: Self-pay | Admitting: Family Medicine

## 2023-07-13 LAB — COMPREHENSIVE METABOLIC PANEL WITH GFR
ALT: 15 IU/L (ref 0–32)
AST: 22 IU/L (ref 0–40)
Albumin: 4.5 g/dL (ref 3.8–4.8)
Alkaline Phosphatase: 62 IU/L (ref 44–121)
BUN/Creatinine Ratio: 17 (ref 12–28)
BUN: 13 mg/dL (ref 8–27)
Bilirubin Total: 0.3 mg/dL (ref 0.0–1.2)
CO2: 28 mmol/L (ref 20–29)
Calcium: 9.7 mg/dL (ref 8.7–10.3)
Chloride: 101 mmol/L (ref 96–106)
Creatinine, Ser: 0.75 mg/dL (ref 0.57–1.00)
Globulin, Total: 2.3 g/dL (ref 1.5–4.5)
Glucose: 82 mg/dL (ref 70–99)
Potassium: 4.4 mmol/L (ref 3.5–5.2)
Sodium: 141 mmol/L (ref 134–144)
Total Protein: 6.8 g/dL (ref 6.0–8.5)
eGFR: 85 mL/min/{1.73_m2} (ref >59)

## 2023-07-13 LAB — CBC WITH DIFFERENTIAL/PLATELET
Basophils Absolute: 0.1 10*3/uL (ref 0.0–0.2)
Basos: 1 %
EOS (ABSOLUTE): 0 10*3/uL (ref 0.0–0.4)
Eos: 0 %
Hematocrit: 43.5 % (ref 34.0–46.6)
Hemoglobin: 14 g/dL (ref 11.1–15.9)
Immature Grans (Abs): 0 10*3/uL (ref 0.0–0.1)
Immature Granulocytes: 0 %
Lymphocytes Absolute: 1.6 10*3/uL (ref 0.7–3.1)
Lymphs: 21 %
MCH: 30.3 pg (ref 26.6–33.0)
MCHC: 32.2 g/dL (ref 31.5–35.7)
MCV: 94 fL (ref 79–97)
Monocytes Absolute: 0.5 10*3/uL (ref 0.1–0.9)
Monocytes: 6 %
Neutrophils Absolute: 5.4 10*3/uL (ref 1.4–7.0)
Neutrophils: 72 %
Platelets: 164 10*3/uL (ref 150–450)
RBC: 4.62 x10E6/uL (ref 3.77–5.28)
RDW: 13.3 % (ref 11.7–15.4)
WBC: 7.6 10*3/uL (ref 3.4–10.8)

## 2023-07-13 LAB — LIPID PANEL W/O CHOL/HDL RATIO
Cholesterol, Total: 275 mg/dL — ABNORMAL HIGH (ref 100–199)
HDL: 86 mg/dL (ref >39)
LDL Chol Calc (NIH): 176 mg/dL — ABNORMAL HIGH (ref 0–99)
Triglycerides: 80 mg/dL (ref 0–149)
VLDL Cholesterol Cal: 13 mg/dL (ref 5–40)

## 2023-07-13 LAB — TSH: TSH: 1.11 u[IU]/mL (ref 0.450–4.500)

## 2023-07-19 ENCOUNTER — Other Ambulatory Visit
Admission: RE | Admit: 2023-07-19 | Discharge: 2023-07-19 | Disposition: A | Discharge status: Home / Self Care | Payer: Self-pay | Source: Ambulatory Visit | Attending: Family Medicine | Admitting: Family Medicine

## 2023-07-19 ENCOUNTER — Ambulatory Visit
Admission: RE | Admit: 2023-07-19 | Discharge: 2023-07-19 | Disposition: A | Discharge status: Home / Self Care | Payer: Self-pay | Source: Ambulatory Visit | Attending: Family Medicine | Admitting: Family Medicine

## 2023-07-19 ENCOUNTER — Ambulatory Visit
Admission: RE | Admit: 2023-07-19 | Discharge: 2023-07-19 | Disposition: A | Discharge status: Home / Self Care | Source: Ambulatory Visit | Attending: Family Medicine | Admitting: Family Medicine

## 2023-07-19 DIAGNOSIS — Z1231 Encounter for screening mammogram for malignant neoplasm of breast: Secondary | ICD-10-CM | POA: Diagnosis present

## 2023-07-19 DIAGNOSIS — Z006 Encounter for examination for normal comparison and control in clinical research program: Secondary | ICD-10-CM

## 2023-07-19 DIAGNOSIS — M79604 Pain in right leg: Secondary | ICD-10-CM | POA: Diagnosis present

## 2023-07-26 ENCOUNTER — Ambulatory Visit: Payer: Self-pay | Admitting: Family Medicine

## 2023-07-30 LAB — GENECONNECT MOLECULAR SCREEN: Genetic Analysis Overall Interpretation: NEGATIVE

## 2023-09-07 ENCOUNTER — Ambulatory Visit

## 2023-11-01 ENCOUNTER — Other Ambulatory Visit
